# Patient Record
Sex: Male | Born: 1965 | Race: White | Hispanic: No | Smoking: Never smoker
Health system: Southern US, Community
[De-identification: ages and names within clinical notes are randomized; demographics above are authoritative.]

## PROBLEM LIST (undated history)

## (undated) DIAGNOSIS — F319 Bipolar disorder, unspecified: Secondary | ICD-10-CM

## (undated) DIAGNOSIS — K219 Gastro-esophageal reflux disease without esophagitis: Secondary | ICD-10-CM

## (undated) DIAGNOSIS — T7840XA Allergy, unspecified, initial encounter: Secondary | ICD-10-CM

## (undated) DIAGNOSIS — F4310 Post-traumatic stress disorder, unspecified: Secondary | ICD-10-CM

## (undated) DIAGNOSIS — R7303 Prediabetes: Secondary | ICD-10-CM

## (undated) DIAGNOSIS — M109 Gout, unspecified: Secondary | ICD-10-CM

## (undated) DIAGNOSIS — F329 Major depressive disorder, single episode, unspecified: Secondary | ICD-10-CM

## (undated) DIAGNOSIS — I10 Essential (primary) hypertension: Secondary | ICD-10-CM

## (undated) DIAGNOSIS — E538 Deficiency of other specified B group vitamins: Secondary | ICD-10-CM

## (undated) DIAGNOSIS — F419 Anxiety disorder, unspecified: Secondary | ICD-10-CM

## (undated) DIAGNOSIS — F32A Depression, unspecified: Secondary | ICD-10-CM

## (undated) HISTORY — PX: DENTAL SURGERY: SHX609

## (undated) HISTORY — PX: TYMPANOSTOMY TUBE PLACEMENT: SHX32

## (undated) HISTORY — PX: ADENOIDECTOMY: SUR15

## (undated) HISTORY — DX: Deficiency of other specified B group vitamins: E53.8

## (undated) HISTORY — DX: Gastro-esophageal reflux disease without esophagitis: K21.9

## (undated) HISTORY — PX: TONSILLECTOMY: SUR1361

## (undated) HISTORY — DX: Post-traumatic stress disorder, unspecified: F43.10

## (undated) HISTORY — DX: Prediabetes: R73.03

## (undated) HISTORY — DX: Bipolar disorder, unspecified: F31.9

## (undated) HISTORY — DX: Depression, unspecified: F32.A

## (undated) HISTORY — DX: Anxiety disorder, unspecified: F41.9

## (undated) HISTORY — DX: Allergy, unspecified, initial encounter: T78.40XA

---
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
## 2004-06-27 ENCOUNTER — Emergency Department: Payer: Self-pay | Admitting: Internal Medicine

## 2004-07-25 ENCOUNTER — Emergency Department: Payer: Self-pay | Admitting: Emergency Medicine

## 2006-02-18 ENCOUNTER — Emergency Department: Payer: Self-pay | Admitting: Emergency Medicine

## 2006-02-23 ENCOUNTER — Emergency Department: Payer: Self-pay | Admitting: Emergency Medicine

## 2006-10-04 ENCOUNTER — Emergency Department: Payer: Self-pay | Admitting: Emergency Medicine

## 2006-10-21 ENCOUNTER — Emergency Department: Payer: Self-pay | Admitting: Emergency Medicine

## 2006-12-08 ENCOUNTER — Emergency Department: Payer: Self-pay | Admitting: Emergency Medicine

## 2007-04-05 ENCOUNTER — Emergency Department: Payer: Self-pay | Admitting: Emergency Medicine

## 2007-08-16 ENCOUNTER — Emergency Department: Payer: Self-pay | Admitting: Emergency Medicine

## 2008-03-17 ENCOUNTER — Emergency Department: Payer: Self-pay | Admitting: Emergency Medicine

## 2008-10-20 ENCOUNTER — Emergency Department: Payer: Self-pay | Admitting: Emergency Medicine

## 2008-11-06 ENCOUNTER — Emergency Department: Payer: Self-pay | Admitting: Emergency Medicine

## 2009-12-28 ENCOUNTER — Emergency Department: Payer: Self-pay | Admitting: Emergency Medicine

## 2010-05-29 ENCOUNTER — Emergency Department: Payer: Self-pay | Admitting: Emergency Medicine

## 2010-06-28 ENCOUNTER — Emergency Department: Payer: Self-pay | Admitting: Emergency Medicine

## 2011-11-05 ENCOUNTER — Emergency Department: Payer: Self-pay | Admitting: *Deleted

## 2012-07-17 ENCOUNTER — Emergency Department: Payer: Self-pay | Admitting: Internal Medicine

## 2012-12-12 ENCOUNTER — Emergency Department: Payer: Self-pay | Admitting: Emergency Medicine

## 2013-04-25 ENCOUNTER — Emergency Department: Payer: Self-pay | Admitting: Emergency Medicine

## 2013-05-14 ENCOUNTER — Emergency Department: Payer: Self-pay | Admitting: Internal Medicine

## 2013-10-06 ENCOUNTER — Emergency Department: Payer: Self-pay | Admitting: Emergency Medicine

## 2013-10-06 LAB — CBC WITH DIFFERENTIAL/PLATELET
BASOS PCT: 1.1 %
Basophil #: 0.1 10*3/uL (ref 0.0–0.1)
Eosinophil #: 0.3 10*3/uL (ref 0.0–0.7)
Eosinophil %: 3.8 %
HCT: 42.4 % (ref 40.0–52.0)
HGB: 14.4 g/dL (ref 13.0–18.0)
Lymphocyte #: 2.5 10*3/uL (ref 1.0–3.6)
Lymphocyte %: 27.5 %
MCH: 30.6 pg (ref 26.0–34.0)
MCHC: 34 g/dL (ref 32.0–36.0)
MCV: 90 fL (ref 80–100)
MONOS PCT: 8.7 %
Monocyte #: 0.8 x10 3/mm (ref 0.2–1.0)
NEUTROS ABS: 5.4 10*3/uL (ref 1.4–6.5)
Neutrophil %: 58.9 %
PLATELETS: 212 10*3/uL (ref 150–440)
RBC: 4.72 10*6/uL (ref 4.40–5.90)
RDW: 13.8 % (ref 11.5–14.5)
WBC: 9.1 10*3/uL (ref 3.8–10.6)

## 2013-10-06 LAB — COMPREHENSIVE METABOLIC PANEL
ALBUMIN: 3.7 g/dL (ref 3.4–5.0)
ALT: 42 U/L (ref 12–78)
ANION GAP: 6 — AB (ref 7–16)
Alkaline Phosphatase: 116 U/L
BILIRUBIN TOTAL: 0.6 mg/dL (ref 0.2–1.0)
BUN: 10 mg/dL (ref 7–18)
CHLORIDE: 108 mmol/L — AB (ref 98–107)
CREATININE: 1.3 mg/dL (ref 0.60–1.30)
Calcium, Total: 8.9 mg/dL (ref 8.5–10.1)
Co2: 26 mmol/L (ref 21–32)
EGFR (Non-African Amer.): >60
GLUCOSE: 130 mg/dL — AB (ref 65–99)
Osmolality: 280 (ref 275–301)
Potassium: 3.5 mmol/L (ref 3.5–5.1)
SGOT(AST): 20 U/L (ref 15–37)
Sodium: 140 mmol/L (ref 136–145)
Total Protein: 7.5 g/dL (ref 6.4–8.2)

## 2014-01-07 ENCOUNTER — Emergency Department: Payer: Self-pay | Admitting: Emergency Medicine

## 2014-01-18 ENCOUNTER — Emergency Department: Payer: Self-pay | Admitting: Emergency Medicine

## 2014-03-20 ENCOUNTER — Emergency Department (HOSPITAL_COMMUNITY)
Admission: EM | Admit: 2014-03-20 | Discharge: 2014-03-21 | Disposition: A | Discharge status: Home / Self Care | Payer: Self-pay | Attending: Emergency Medicine | Admitting: Emergency Medicine

## 2014-03-20 ENCOUNTER — Encounter (HOSPITAL_COMMUNITY): Payer: Self-pay | Admitting: Emergency Medicine

## 2014-03-20 ENCOUNTER — Emergency Department (HOSPITAL_COMMUNITY): Payer: Self-pay

## 2014-03-20 DIAGNOSIS — Y929 Unspecified place or not applicable: Secondary | ICD-10-CM | POA: Insufficient documentation

## 2014-03-20 DIAGNOSIS — S6990XA Unspecified injury of unspecified wrist, hand and finger(s), initial encounter: Principal | ICD-10-CM

## 2014-03-20 DIAGNOSIS — S59919A Unspecified injury of unspecified forearm, initial encounter: Principal | ICD-10-CM

## 2014-03-20 DIAGNOSIS — Y9389 Activity, other specified: Secondary | ICD-10-CM | POA: Insufficient documentation

## 2014-03-20 DIAGNOSIS — S59909A Unspecified injury of unspecified elbow, initial encounter: Secondary | ICD-10-CM | POA: Insufficient documentation

## 2014-03-20 DIAGNOSIS — X58XXXA Exposure to other specified factors, initial encounter: Secondary | ICD-10-CM | POA: Insufficient documentation

## 2014-03-20 DIAGNOSIS — S6992XA Unspecified injury of left wrist, hand and finger(s), initial encounter: Secondary | ICD-10-CM

## 2014-03-20 DIAGNOSIS — Z880 Allergy status to penicillin: Secondary | ICD-10-CM | POA: Insufficient documentation

## 2014-03-20 MED ORDER — OXYCODONE-ACETAMINOPHEN 5-325 MG PO TABS
1.0000 | ORAL_TABLET | Freq: Once | ORAL | Status: AC
  Administered 2014-03-20: 1 via ORAL
  Filled 2014-03-20: qty 1

## 2014-03-20 NOTE — ED Provider Notes (Signed)
CSN: [id number]     Arrival date & time 03/20/14  2301 History  This chart was scribed for non-physician practitioner, Fayrene Helper, PA-C working with Olivia Mackie, MD by Luisa Dago, ED scribe. This patient was seen in room TR11C/TR11C and the patient's care was started at 11:30 PM.    Chief Complaint  Patient presents with  . Wrist Pain     The history is provided by the patient. No language interpreter was used.   HPI Comments: Wayne Brown is a 48 y.o. male who presents to the Emergency Department complaining of acute onset of left wrist pain that occurred today. Pt states that he was fixing a car when he started experiencing the pain in his affected wrist. He states that in the past he was diagnosed with carpel tunnel at a past ED visit, but never followed up with diagnosis at the PCP. Pt reports a tingling sensation to the affected hand and mild swelling. Denies any fever or chills.   History reviewed. No pertinent past medical history. History reviewed. No pertinent past surgical history. No family history on file. History  Substance Use Topics  . Smoking status: Never Smoker   . Smokeless tobacco: Not on file  . Alcohol Use: No    Review of Systems  Constitutional: Negative for fever and chills.  HENT: Negative for congestion.   Respiratory: Negative for cough and shortness of breath.   Musculoskeletal: Positive for arthralgias and joint swelling. Negative for myalgias.  Neurological: Negative for dizziness, weakness, light-headedness and headaches.   Allergies  Erythromycin; Penicillins; Toradol; and Tramadol  Home Medications   Prior to Admission medications   Not on File   BP 152/78  Pulse 87  Temp(Src) 97.8 F (36.6 C) (Oral)  Resp 22  Ht  (1.956 m)  Wt 245 lb (111.131 kg)  BMI 29.05 kg/m2  SpO2 97%  Physical Exam  Nursing note and vitals reviewed. Constitutional: He is oriented to person, place, and time. He appears well-developed and  well-nourished. No distress.  HENT:  Head: Normocephalic and atraumatic.  Eyes: Conjunctivae and EOM are normal.  Neck: Neck supple.  Cardiovascular: Normal rate.   Pulmonary/Chest: Effort normal. No respiratory distress.  Musculoskeletal: Normal range of motion.  Left hand:FROM throughout all finger decreased grip strength secondary to pain. Brisk capillary refill.  Left wrist: mild edema noted to the volar aspect of wrist with tenderness along the volar aspect of forearm. Decreased flexion, extension, and supination, and pronation secondary to pain. Radial pulse is 2 plus. No abscess. No erythema. No warmth. No evidence of joint infection. Compartment is soft.  Elbows are normal with FROM.  Neurological: He is alert and oriented to person, place, and time.  Skin: Skin is warm and dry.  Psychiatric: He has a normal mood and affect. His behavior is normal.    ED Course  Procedures (including critical care time)  DIAGNOSTIC STUDIES: Oxygen Saturation is 97% on RA, normal by my interpretation.    COORDINATION OF CARE: 11:37 PM-suspect muscle/tendon injury.  Doubt septic joint, or infection.  Will order X-ray of left wrist.  Pt advised of plan for treatment and pt agrees.  12:25 AM Xray neg for acute fx.  Pt is NVI.  Doubt DVT.  Wrist brace provide for comfort, RICE therapy discussed.  Pain medication given.  Hand specialist referral as needed, return precaution discussed.    Labs Review Labs Reviewed - No data to display  Imaging Review Dg Wrist Complete  Left  03/21/2014   CLINICAL DATA:  WRIST PAIN  EXAM: LEFT WRIST - COMPLETE 3+ VIEW  COMPARISON:  None.  FINDINGS: There is no evidence of fracture or dislocation. There is no evidence of arthropathy or other focal bone abnormality. Soft tissues are unremarkable. Carpal rows intact.  IMPRESSION: Negative.   Electronically Signed   By: Oley Balm M.D.   On: 03/21/2014 00:06     EKG Interpretation None      MDM   Final  diagnoses:  Left wrist injury, initial encounter    BP 152/78  Pulse 87  Temp(Src) 97.8 F (36.6 C) (Oral)  Resp 22  Ht  (1.956 m)  Wt 245 lb (111.131 kg)  BMI 29.05 kg/m2  SpO2 97%  I have reviewed nursing notes and vital signs. I personally reviewed the imaging tests through PACS system  I reviewed available ER/hospitalization records thought the EMR   I personally performed the services described in this documentation, which was scribed in my presence. The recorded information has been reviewed and is accurate.    Fayrene Helper, PA-C 03/21/14 [id number]

## 2014-03-20 NOTE — ED Notes (Signed)
Pt presents with left wrist pain and swelling after working on a car this afternoon- pt reports hx of carpal tunnel.  Denies injuring wrist while working on car, radial pulse present.  Pt able to move fingers without difficulty.

## 2014-03-21 MED ORDER — OXYCODONE-ACETAMINOPHEN 5-325 MG PO TABS: 1.0000 | ORAL_TABLET | Freq: Three times a day (TID) | ORAL | Status: DC | PRN

## 2014-03-21 NOTE — Discharge Instructions (Signed)
NO evidence of any broken bone.  You may have tendon or muscle injury that would best be evaluated by a hand specialist.  Follow up closely.  Wear splint for support.  Take pain medication as needed.  Return if your condition worsen, you develop rash, chest pain, trouble breathing, numbness or if you have other concerns.     Wrist Pain Wrist injuries are frequent in adults and children. A sprain is an injury to the ligaments that hold your bones together. A strain is an injury to muscle or muscle cord-like structures (tendons) from stretching or pulling. Generally, when wrists are moderately tender to touch following a fall or injury, a break in the bone (fracture) may be present. Most wrist sprains or strains are better in 3 to 5 days, but complete healing may take several weeks. HOME CARE INSTRUCTIONS   Put ice on the injured area.  Put ice in a plastic bag.  Place a towel between your skin and the bag.  Leave the ice on for 15-20 minutes, 3-4 times a day, for the first 2 days, or as directed by your health care provider.  Keep your arm raised above the level of your heart whenever possible to reduce swelling and pain.  Rest the injured area for at least 48 hours or as directed by your health care provider.  If a splint or elastic bandage has been applied, use it for as long as directed by your health care provider or until seen by a health care provider for a follow-up exam.  Only take over-the-counter or prescription medicines for pain, discomfort, or fever as directed by your health care provider.  Keep all follow-up appointments. You may need to follow up with a specialist or have follow-up X-rays. Improvement in pain level is not a guarantee that you did not fracture a bone in your wrist. The only way to determine whether or not you have a broken bone is by X-ray. SEEK IMMEDIATE MEDICAL CARE IF:   Your fingers are swollen, very red, white, or cold and blue.  Your fingers are numb  or tingling.  You have increasing pain.  You have difficulty moving your fingers. MAKE SURE YOU:   Understand these instructions.  Will watch your condition.  Will get help right away if you are not doing well or get worse. Document Released: 04/15/2005 Document Revised: 07/11/2013 Document Reviewed: 08/27/2010 [facility] Patient Information 2015 [place], [place]. This information is not intended to replace advice given to you by your health care provider. Make sure you discuss any questions you have with your health care provider.

## 2014-03-21 NOTE — ED Provider Notes (Signed)
Medical screening examination/treatment/procedure(s) were performed by non-physician practitioner and as supervising physician I was immediately available for consultation/collaboration.   EKG Interpretation None       Rolonda Pontarelli M Raiza Kiesel, MD 03/21/14 0429 

## 2014-09-17 ENCOUNTER — Emergency Department: Payer: Self-pay | Admitting: Emergency Medicine

## 2014-10-04 ENCOUNTER — Emergency Department: Payer: Self-pay | Admitting: Emergency Medicine

## 2014-11-07 ENCOUNTER — Emergency Department
Admit: 2014-11-07 | Disposition: A | Discharge status: Home / Self Care | Payer: Self-pay | Admitting: Emergency Medicine

## 2014-12-13 ENCOUNTER — Ambulatory Visit: Payer: Self-pay

## 2014-12-13 ENCOUNTER — Ambulatory Visit: Payer: Self-pay | Admitting: Ophthalmology

## 2014-12-20 ENCOUNTER — Ambulatory Visit: Payer: Self-pay | Admitting: Ophthalmology

## 2014-12-26 ENCOUNTER — Ambulatory Visit: Payer: Self-pay | Admitting: Internal Medicine

## 2014-12-26 ENCOUNTER — Ambulatory Visit: Payer: Self-pay | Admitting: Ophthalmology

## 2015-03-14 ENCOUNTER — Ambulatory Visit: Payer: Self-pay | Admitting: Ophthalmology

## 2015-03-14 ENCOUNTER — Ambulatory Visit: Payer: Self-pay

## 2015-04-08 ENCOUNTER — Encounter (INDEPENDENT_AMBULATORY_CARE_PROVIDER_SITE_OTHER): Payer: Self-pay

## 2015-04-08 ENCOUNTER — Ambulatory Visit: Payer: Self-pay

## 2015-04-18 ENCOUNTER — Other Ambulatory Visit: Payer: Self-pay

## 2015-04-25 ENCOUNTER — Ambulatory Visit: Payer: Self-pay

## 2015-08-20 ENCOUNTER — Encounter: Payer: Self-pay | Admitting: Urgent Care

## 2015-08-20 DIAGNOSIS — R2231 Localized swelling, mass and lump, right upper limb: Secondary | ICD-10-CM | POA: Insufficient documentation

## 2015-08-20 DIAGNOSIS — M25511 Pain in right shoulder: Secondary | ICD-10-CM | POA: Insufficient documentation

## 2015-08-20 NOTE — ED Notes (Signed)
Patient presents with c/o RIGHT shoulder pain with (+) radiation into arm and hand. (+) RIGHT hand swelling. Patient denies injury. No numbness or tingling reported.

## 2015-08-21 ENCOUNTER — Emergency Department
Admission: EM | Admit: 2015-08-21 | Discharge: 2015-08-21 | Disposition: A | Discharge status: Home / Self Care | Payer: Self-pay | Attending: Emergency Medicine | Admitting: Emergency Medicine

## 2015-08-29 ENCOUNTER — Other Ambulatory Visit: Payer: Self-pay

## 2015-08-29 LAB — CBC AND DIFFERENTIAL
HCT: 42 % (ref 41–53)
Hemoglobin: 14.9 g/dL (ref 13.5–17.5)
Neutrophils Absolute: 5 /uL
Platelets: 246 10*3/uL (ref 150–399)
WBC: 8.2 10^3/mL

## 2015-08-29 LAB — BASIC METABOLIC PANEL
BUN: 8 mg/dL (ref 4–21)
CREATININE: 1.1 mg/dL (ref 0.6–1.3)
Glucose: 86 mg/dL
POTASSIUM: 4 mmol/L (ref 3.4–5.3)
Sodium: 139 mmol/L (ref 137–147)

## 2015-08-29 LAB — LIPID PANEL
Cholesterol: 158 mg/dL (ref 0–200)
HDL: 32 mg/dL — AB (ref 35–70)
LDL Cholesterol: 98 mg/dL
TRIGLYCERIDES: 141 mg/dL (ref 40–160)

## 2015-08-29 LAB — HEPATIC FUNCTION PANEL
ALT: 25 U/L (ref 10–40)
AST: 17 U/L (ref 14–40)
Alkaline Phosphatase: 144 U/L — AB (ref 25–125)
BILIRUBIN, TOTAL: 0.9 mg/dL

## 2015-09-05 DIAGNOSIS — K219 Gastro-esophageal reflux disease without esophagitis: Secondary | ICD-10-CM

## 2015-09-05 DIAGNOSIS — M25539 Pain in unspecified wrist: Secondary | ICD-10-CM

## 2015-09-05 DIAGNOSIS — M25439 Effusion, unspecified wrist: Secondary | ICD-10-CM | POA: Insufficient documentation

## 2015-09-12 ENCOUNTER — Ambulatory Visit: Payer: Self-pay | Admitting: Ophthalmology

## 2015-09-26 ENCOUNTER — Ambulatory Visit: Payer: Self-pay | Admitting: Ophthalmology

## 2015-09-26 ENCOUNTER — Ambulatory Visit: Payer: Self-pay

## 2015-10-17 ENCOUNTER — Ambulatory Visit: Payer: Self-pay

## 2015-10-17 VITALS — BP 126/84 | HR 67 | Resp 14 | Wt 244.0 lb

## 2015-10-17 DIAGNOSIS — Z91199 Patient's noncompliance with other medical treatment and regimen due to unspecified reason: Secondary | ICD-10-CM

## 2015-10-17 DIAGNOSIS — Z5329 Procedure and treatment not carried out because of patient's decision for other reasons: Secondary | ICD-10-CM

## 2015-11-14 ENCOUNTER — Ambulatory Visit: Payer: Self-pay | Admitting: Family Medicine

## 2015-11-14 VITALS — BP 125/84 | HR 73 | Ht 77.0 in | Wt 259.0 lb

## 2015-11-14 DIAGNOSIS — F4310 Post-traumatic stress disorder, unspecified: Secondary | ICD-10-CM | POA: Insufficient documentation

## 2015-11-14 DIAGNOSIS — R748 Abnormal levels of other serum enzymes: Secondary | ICD-10-CM | POA: Insufficient documentation

## 2015-11-14 DIAGNOSIS — K219 Gastro-esophageal reflux disease without esophagitis: Secondary | ICD-10-CM

## 2015-11-14 DIAGNOSIS — M109 Gout, unspecified: Secondary | ICD-10-CM | POA: Insufficient documentation

## 2015-11-14 DIAGNOSIS — R635 Abnormal weight gain: Secondary | ICD-10-CM

## 2015-11-14 DIAGNOSIS — R0789 Other chest pain: Secondary | ICD-10-CM | POA: Insufficient documentation

## 2015-11-14 DIAGNOSIS — R1310 Dysphagia, unspecified: Secondary | ICD-10-CM | POA: Insufficient documentation

## 2015-11-14 MED ORDER — FEXOFENADINE HCL 180 MG PO TABS: 180.0000 mg | ORAL_TABLET | Freq: Every day | ORAL | Status: DC

## 2015-11-14 MED ORDER — RANITIDINE HCL 150 MG PO TABS: 150.0000 mg | ORAL_TABLET | Freq: Two times a day (BID) | ORAL | Status: DC

## 2015-11-14 NOTE — Patient Instructions (Addendum)
Start back on the ranitidine twice a day Limit triggers foods and drinks If your symptoms don't improve over the next week or two, let us know and we'll refer you to a gastroenterologist If you don't hear about your labs in one week, please give us a call Try to work on modest weight loss, and aim to lose 20 pounds over the next 3 months  Heartburn Heartburn is a type of pain or discomfort that can happen in the throat or chest. It is often described as a burning pain. It may also cause a bad taste in the mouth. Heartburn may feel worse when you lie down or bend over, and it is often worse at night. Heartburn may be caused by stomach contents that move back up into the esophagus (reflux). HOME CARE INSTRUCTIONS Take these actions to decrease your discomfort and to help avoid complications. Diet  Follow a diet as recommended by your health care provider. This may involve avoiding foods and drinks such as:  Coffee and tea (with or without caffeine).  Drinks that contain alcohol.  Energy drinks and sports drinks.  Carbonated drinks or sodas.  Chocolate and cocoa.  Peppermint and mint flavorings.  Garlic and onions.  Horseradish.  Spicy and acidic foods, including peppers, chili powder, curry powder, vinegar, hot sauces, and barbecue sauce.  Citrus fruit juices and citrus fruits, such as oranges, lemons, and limes.  Tomato-based foods, such as red sauce, chili, salsa, and pizza with red sauce.  Fried and fatty foods, such as donuts, french fries, potato chips, and high-fat dressings.  High-fat meats, such as hot dogs and fatty cuts of red and white meats, such as rib eye steak, sausage, ham, and bacon.  High-fat dairy items, such as whole milk, butter, and cream cheese.  Eat small, frequent meals instead of large meals.  Avoid drinking large amounts of liquid with your meals.  Avoid eating meals during the 2-3 hours before bedtime.  Avoid lying down right after you  eat.  Do not exercise right after you eat. General Instructions  Pay attention to any changes in your symptoms.  Take over-the-counter and prescription medicines only as told by your health care provider. Do not take aspirin, ibuprofen, or other NSAIDs unless your health care provider told you to do so.  Do not use any tobacco products, including cigarettes, chewing tobacco, and e-cigarettes. If you need help quitting, ask your health care provider.  Wear loose-fitting clothing. Do not wear anything tight around your waist that causes pressure on your abdomen.  Raise (elevate) the head of your bed about 6 inches (15 cm).  Try to reduce your stress, such as with yoga or meditation. If you need help reducing stress, ask your health care provider.  If you are overweight, reduce your weight to an amount that is healthy for you. Ask your health care provider for guidance about a safe weight loss goal.  Keep all follow-up visits as told by your health care provider. This is important. SEEK MEDICAL CARE IF:  You have new symptoms.  You have unexplained weight loss.  You have difficulty swallowing, or it hurts to swallow.  You have wheezing or a persistent cough.  Your symptoms do not improve with treatment.  You have frequent heartburn for more than two weeks. SEEK IMMEDIATE MEDICAL CARE IF:  You have pain in your arms, neck, jaw, teeth, or back.  You feel sweaty, dizzy, or light-headed.  You have chest pain or shortness of breath.  You vomit and your vomit looks like blood or coffee grounds.  Your stool is bloody or black.   This information is not intended to replace advice given to you by your health care provider. Make sure you discuss any questions you have with your health care provider.   Document Released: 11/22/2008 Document Revised: 03/27/2015 Document Reviewed: 10/31/2014 Elsevier Interactive Patient Education [employer]

## 2015-11-14 NOTE — Assessment & Plan Note (Signed)
Limit triggers; get back on BID H2 blocker; check H pylori; if not improving, then refer to GI

## 2015-11-14 NOTE — Assessment & Plan Note (Signed)
Limit triggers; start on BID H2 blockers; offered referral to GI; patient declined at this time, but I encouraged him to call if symptoms not resolving in 1-2 weeks; check H pylori

## 2015-11-14 NOTE — Progress Notes (Signed)
BP 125/84 mmHg  Pulse 73  Ht  (1.956 m)  Wt 259 lb (117.482 kg)  BMI 30.71 kg/m2   Subjective:    Patient ID: Wayne Brown, male    DOB: 05-19-1966, 50 y.o.   MRN: [id number]  HPI: Wayne Brown is a 50 y.o. male  Chief Complaint  Patient presents with  . Gastroesophageal Reflux    pt rpts feeling like oral tablets stay "stuck in throat"  . Medication Refill   He is taking ranitidine 150 mg BID for heartburn; he actually tried decreasing the pills to just once a day; could feel it start building back up; he gets clear bitter acid coming up in the chest; not heart-like; can relieve it with belching and once the liquid comes up, the sensation goes away; likes spicy foods; tomato-based foods and spicy foods are triggers; no blood in the stool; they just ran an EKG on him  Low HDL; doesn't smoke; a little obese, weight fluctuates, just ate recently but weight is really up from last time  We reviewed his previous labs; they checked his liver enzymes years ago and told hiim something was elevated; believes he was tested for hepatitis in the past; he is germophobic  Eyes have been burning, itching in the medial canthus; coming from the pollen; benadryl makes him sleepy  Takes hydroxyzine and clonazepam for PTSD  Relevant past medical, surgical, family and social history reviewed and updated as indicated. Past Medical History  Diagnosis Date  . Bipolar disorder (HCC)   . Post traumatic stress disorder   . GERD (gastroesophageal reflux disease)    No past surgical history on file.  Family History  Problem Relation Age of Onset  . Cancer Father   Mother colon cancer Father had triple bypass, died at age 43 from 2-3 kinds of cancer; father smoked  Social History  Substance Use Topics  . Smoking status: Never Smoker   . Smokeless tobacco: Never Used  . Alcohol Use: No   Interim medical history since last visit reviewed. Allergies and medications reviewed and  updated.  Review of Systems Per HPI unless specifically indicated above     Objective:    BP 125/84 mmHg  Pulse 73  Ht  (1.956 m)  Wt 259 lb (117.482 kg)  BMI 30.71 kg/m2  Wt Readings from Last 3 Encounters:  12/12/15 244 lb (110.678 kg)  11/14/15 259 lb (117.482 kg)  10/17/15 244 lb (110.678 kg)    Physical Exam  Constitutional: He appears well-developed and well-nourished. No distress.  Cardiovascular: Normal rate and regular rhythm.   Pulmonary/Chest: Effort normal and breath sounds normal.  Abdominal: He exhibits no distension.  Neurological: He is alert.  Skin: Skin is warm.  Psychiatric: He has a normal mood and affect.      Assessment & Plan:   Problem List Items Addressed This Visit      Digestive   Dysphagia    Limit triggers; start on BID H2 blockers; offered referral to GI; patient declined at this time, but I encouraged him to call if symptoms not resolving in 1-2 weeks; check H pylori      Relevant Orders   H Pylori, IGM, IGG, IGA AB (Completed)   GERD (gastroesophageal reflux disease)    Limit triggers; get back on BID H2 blocker; check H pylori; if not improving, then refer to GI        Other   Abnormal weight gain  Relevant Orders   TSH (Completed)   Acute gout   Relevant Orders   Uric acid (Completed)   Alkaline phosphatase elevation - Primary   Relevant Orders   Hepatic Function Panel (6) (Completed)   Alkaline Phosphatase, Isoenzymes (Completed)   Atypical chest pain   PTSD (post-traumatic stress disorder)      Follow up plan: Return in about 4 weeks (around 12/12/2015) for follow-up on GERD symptoms, weight, swallowing.  An after-visit summary was printed and given to the patient at check-out.  Please see the patient instructions which may contain other information and recommendations beyond what is mentioned above in the assessment and plan.  Meds ordered this encounter  Medications  . DISCONTD: hydrOXYzine (ATARAX/VISTARIL)  10 MG tablet    Sig: Take by mouth 3 (three) times daily as needed (unknown dose per pt).  . clonazePAM (KLONOPIN) 0.5 MG tablet    Sig: Take 0.5 mg by mouth 2 (two) times daily.  Marland Kitchen. DISCONTD: ranitidine (ZANTAC) 150 MG tablet    Sig: Take 1 tablet (150 mg total) by mouth 2 (two) times daily.    Dispense:  180 tablet    Refill:  1  . DISCONTD: fexofenadine (ALLEGRA ALLERGY) 180 MG tablet    Sig: Take 1 tablet (180 mg total) by mouth daily. PRN allergies    Dispense:  90 tablet    Refill:  1    Orders Placed This Encounter  Procedures  . Uric acid  . TSH  . H Pylori, IGM, IGG, IGA AB  . Hepatic Function Panel (6)  . Alkaline Phosphatase, Isoenzymes

## 2015-11-16 LAB — URIC ACID: Uric Acid: 5.2 mg/dL (ref 3.7–8.6)

## 2015-11-16 LAB — HEPATIC FUNCTION PANEL (6)
ALT: 29 IU/L (ref 0–44)
AST: 19 IU/L (ref 0–40)
Albumin: 4.3 g/dL (ref 3.5–5.5)
Alkaline Phosphatase: 141 IU/L — ABNORMAL HIGH (ref 39–117)
BILIRUBIN, DIRECT: 0.19 mg/dL (ref 0.00–0.40)
Bilirubin Total: 0.7 mg/dL (ref 0.0–1.2)

## 2015-11-16 LAB — ALKALINE PHOSPHATASE, ISOENZYMES
BONE FRACTION: 39 % (ref 12–68)
INTESTINAL FRAC.: 0 % (ref 0–18)
LIVER FRACTION: 61 % (ref 13–88)

## 2015-11-16 LAB — H PYLORI, IGM, IGG, IGA AB
H Pylori IgG: 5.5 U/mL — ABNORMAL HIGH (ref 0.0–0.8)
H pylori, IgM Abs: <9 units (ref 0.0–8.9)
H. pylori, IgA Abs: <9 units (ref 0.0–8.9)

## 2015-11-16 LAB — TSH: TSH: 2.48 u[IU]/mL (ref 0.450–4.500)

## 2015-11-19 ENCOUNTER — Other Ambulatory Visit: Payer: Self-pay | Admitting: Urology

## 2015-11-19 ENCOUNTER — Telehealth: Payer: Self-pay

## 2015-11-19 MED ORDER — FEXOFENADINE HCL 180 MG PO TABS: 180.0000 mg | ORAL_TABLET | Freq: Every day | ORAL | Status: DC

## 2015-11-19 MED ORDER — RANITIDINE HCL 150 MG PO TABS: 150.0000 mg | ORAL_TABLET | Freq: Two times a day (BID) | ORAL | Status: DC

## 2015-11-19 MED ORDER — INDOMETHACIN 50 MG PO CAPS: 50.0000 mg | ORAL_CAPSULE | Freq: Three times a day (TID) | ORAL | Status: DC

## 2015-11-19 MED ORDER — FUROSEMIDE 20 MG PO TABS: 20.0000 mg | ORAL_TABLET | ORAL | Status: DC | PRN

## 2015-11-19 MED ORDER — HYDROXYZINE HCL 10 MG PO TABS: 10.0000 mg | ORAL_TABLET | Freq: Three times a day (TID) | ORAL | Status: DC | PRN

## 2015-11-19 NOTE — Telephone Encounter (Signed)
Patient called and stated that [facility]. I noticed that the wrong pharmacy was in the patients record. Can we resend the RX from his 4/27 visit.

## 2015-11-22 IMAGING — CR DG KNEE COMPLETE 4+V*L*
1 series · 5 of 5 positions shown · non-contrast
Comparison: None.

CLINICAL DATA: Semi pro wrestler, injured knee at a match a few
days ago, worsening pain today.

EXAM:
LEFT KNEE - COMPLETE 4+ VIEW

[Series 1: t knee ap left · 0.14mm/px · 5 of 5 slices shown]
[im 1/5]
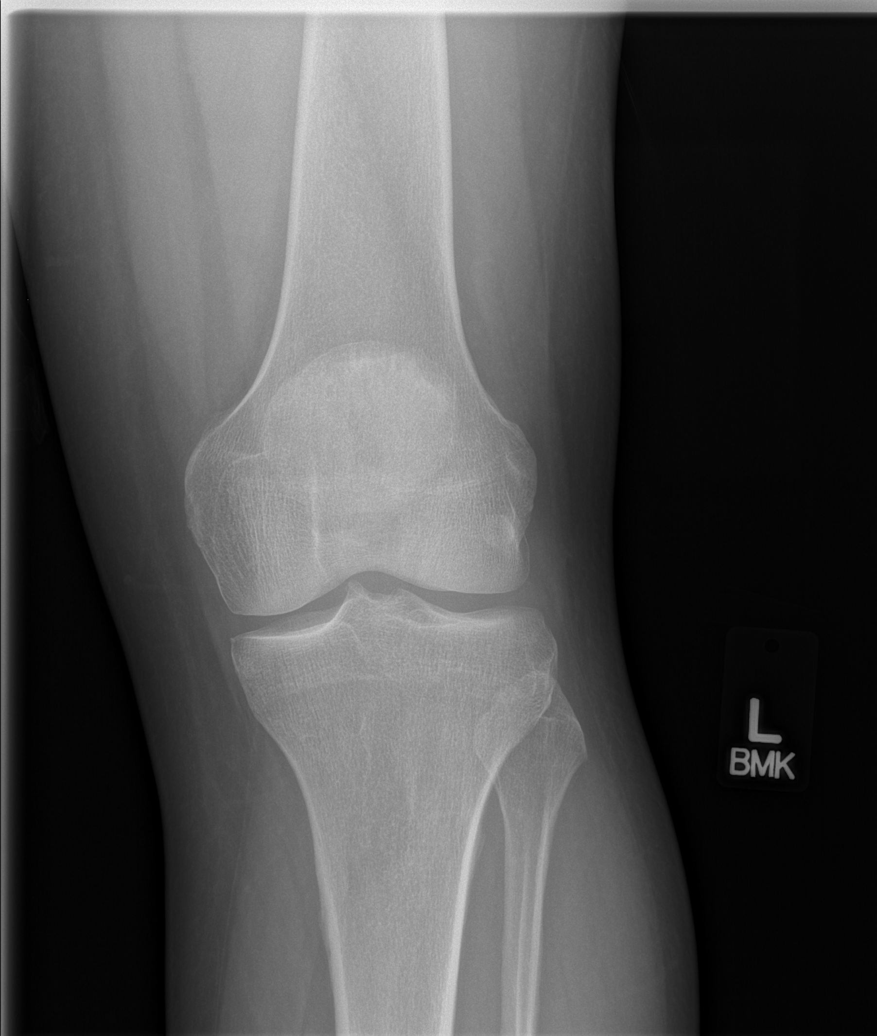
[im 2/5]
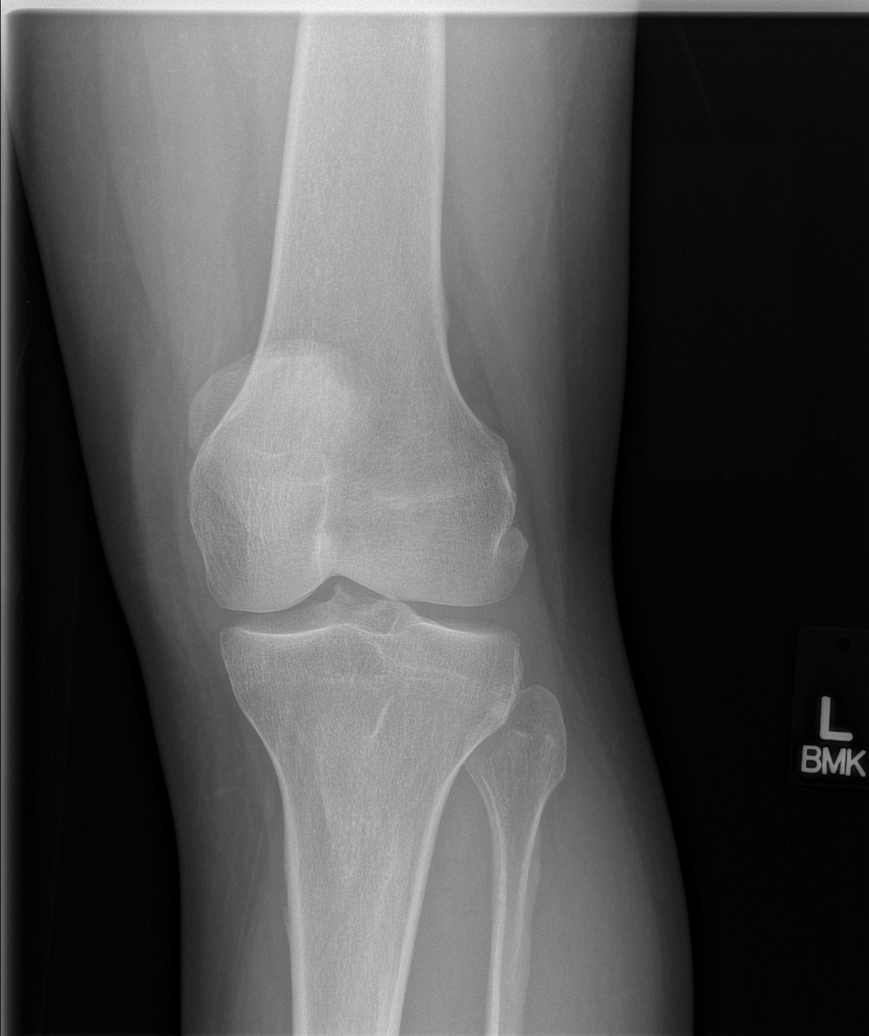
[im 3/5]
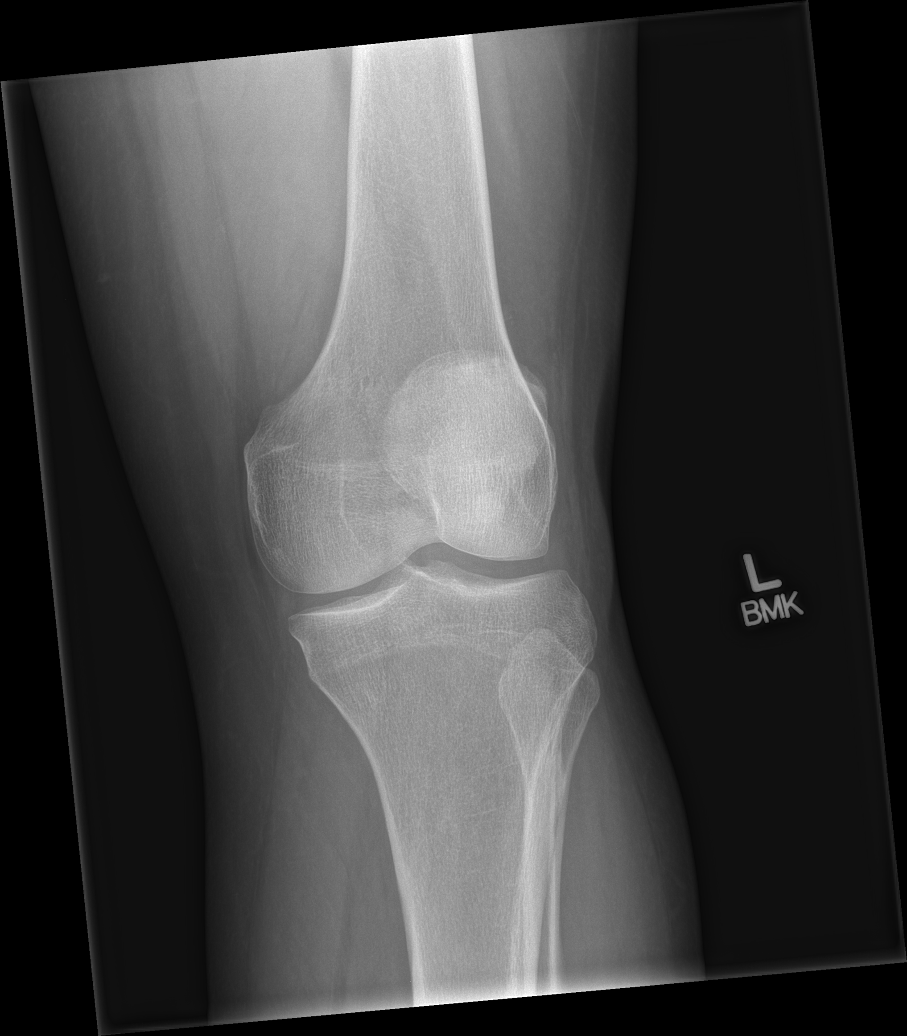
[im 4/5]
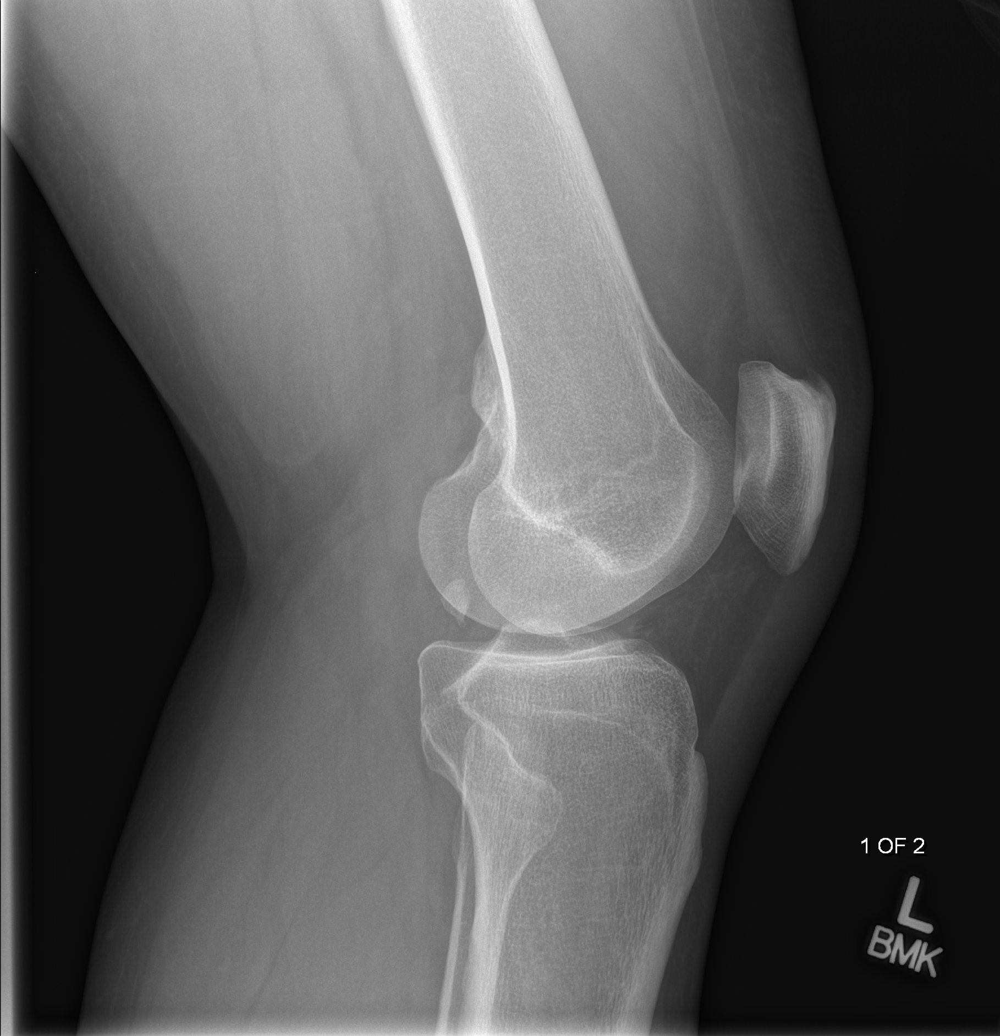
[im 5/5]
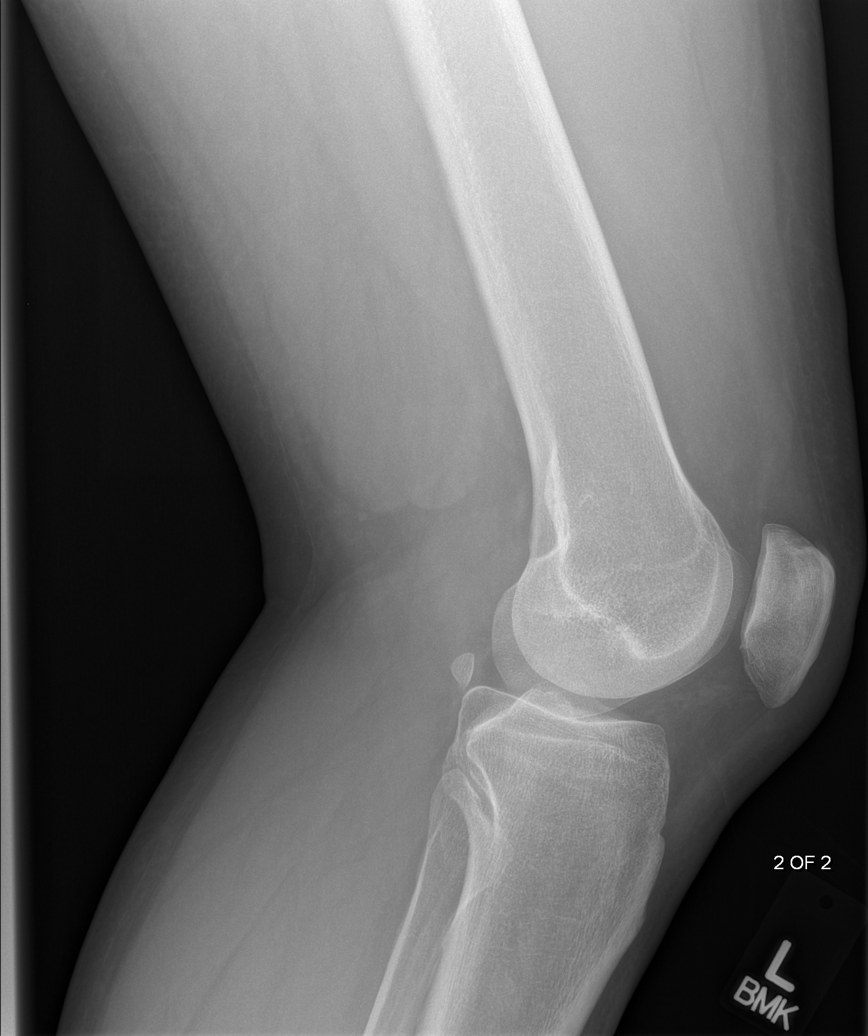

[5 of 5 positions shown; findings below may reference images not displayed]

FINDINGS: There is no evidence of fracture, dislocation, or joint effusion.
There is no evidence of arthropathy or other focal bone abnormality.
Soft tissues are unremarkable.
IMPRESSION: Negative.

  By: Slay Baires

## 2015-12-12 ENCOUNTER — Ambulatory Visit: Payer: Self-pay | Admitting: Family Medicine

## 2015-12-12 VITALS — BP 157/98 | HR 92 | Resp 16 | Ht 76.0 in | Wt 244.0 lb

## 2015-12-12 DIAGNOSIS — M25511 Pain in right shoulder: Secondary | ICD-10-CM

## 2015-12-12 DIAGNOSIS — K219 Gastro-esophageal reflux disease without esophagitis: Secondary | ICD-10-CM

## 2015-12-12 DIAGNOSIS — R748 Abnormal levels of other serum enzymes: Secondary | ICD-10-CM

## 2015-12-12 DIAGNOSIS — N644 Mastodynia: Secondary | ICD-10-CM | POA: Insufficient documentation

## 2015-12-12 MED ORDER — MELOXICAM 15 MG PO TABS: 15.0000 mg | ORAL_TABLET | Freq: Every day | ORAL | Status: DC

## 2015-12-12 NOTE — Progress Notes (Signed)
Patient: Wayne Brown Male    DOB: 1965/08/17   50 y.o.   MRN: [id number] Visit Date: 12/12/2015  Today's Provider: [facility]   Chief Complaint  Patient presents with  . Results  . Shoulder Pain  . Arm Swelling  . Breast Pain    left nipple tenderness   Subjective:    HPI 50 yo male here for recheck on labs. Here to recheck his labs.  Has chronically elevated liver enzyme. No h pylori. Better on medication. Allergies are better.  Also, with shoulder and arm swelling.  Treated for gout.  Told has a pinched nerve. Neck and arm pain on his right arm. Does see psychiatry.  Patient can not sleep from pain. Feels like it was a pinched nerve. Was treated for gout. Did tolerate the gout medication. Does also have chest pain, at his nipple. Sore to touch.   Feels like a stabbing.  Did have a piercing years ago..     Allergies  Allergen Reactions  . Erythromycin Other (See Comments)    Depression per pt  . Toradol [Ketorolac Tromethamine] Nausea And Vomiting  . Tramadol Nausea And Vomiting  . Vicodin [Hydrocodone-Acetaminophen] Nausea And Vomiting  . Lithium Rash    "lithium rash" per pt  . Penicillins Rash    Per pt rxn was an infant, familial   Previous Medications   CLONAZEPAM (KLONOPIN) 0.5 MG TABLET    Take 0.5 mg by mouth 2 (two) times daily.   FEXOFENADINE (ALLEGRA ALLERGY) 180 MG TABLET    Take 1 tablet (180 mg total) by mouth daily. PRN allergies   FUROSEMIDE (LASIX) 20 MG TABLET    Take 1 tablet (20 mg total) by mouth as needed (per pt).   HYDROXYZINE (ATARAX/VISTARIL) 10 MG TABLET    Take 1 tablet (10 mg total) by mouth 3 (three) times daily as needed (unknown dose per pt).   RANITIDINE (ZANTAC) 150 MG TABLET    Take 1 tablet (150 mg total) by mouth 2 (two) times daily.    Review of Systems  Constitutional: Negative.   Musculoskeletal: Positive for joint swelling and arthralgias.  Skin:       Left nipple tender to palpation.     Social  History  Substance Use Topics  . Smoking status: Never Smoker   . Smokeless tobacco: Never Used  . Alcohol Use: No   Objective:   BP 157/98 mmHg  Pulse 92  Resp 16  Ht 6\' 4"  (1.93 m)  Wt 244 lb (110.678 kg)  BMI 29.71 kg/m2  Physical Exam  Constitutional: He appears well-developed and well-nourished.  Cardiovascular: Normal rate and regular rhythm.   Pulmonary/Chest: Effort normal and breath sounds normal.  Musculoskeletal: He exhibits tenderness (right shoulder and left nipple.  ).  Skin:  Tender left nipple with no erythema Scar tissue from previous ring.        Assessment & Plan:     1. Breast pain, left Will check ultrasound.  - US Unlisted Procedure Breast; Future  2. Shoulder pain, right New problem. Treat with Mobic.    - meloxicam (MOBIC) 15 MG tablet; Take 1 tablet (15 mg total) by mouth daily.  Dispense: 90 tablet; Refill: 3  3. Alkaline phosphatase elevation Stable.    4. Gastroesophageal reflux disease, esophagitis presence not specified Stable. Continue current medication.    BP elevated today.  Recheck at folllow up.      Lorie PhenixNancy Tad Fancher, MD  [facility]

## 2016-01-01 ENCOUNTER — Other Ambulatory Visit: Payer: Self-pay | Admitting: Urology

## 2016-01-01 DIAGNOSIS — N644 Mastodynia: Secondary | ICD-10-CM

## 2016-01-09 ENCOUNTER — Ambulatory Visit
Admission: RE | Admit: 2016-01-09 | Discharge: 2016-01-09 | Disposition: A | Discharge status: Home / Self Care | Payer: Self-pay | Source: Ambulatory Visit | Attending: Urology | Admitting: Urology

## 2016-01-09 DIAGNOSIS — N644 Mastodynia: Secondary | ICD-10-CM

## 2016-02-13 ENCOUNTER — Ambulatory Visit: Payer: Self-pay | Admitting: Family Medicine

## 2016-02-13 VITALS — BP 153/91 | HR 111 | Wt 251.0 lb

## 2016-02-13 DIAGNOSIS — L259 Unspecified contact dermatitis, unspecified cause: Secondary | ICD-10-CM

## 2016-02-13 DIAGNOSIS — N62 Hypertrophy of breast: Secondary | ICD-10-CM

## 2016-02-13 MED ORDER — RANITIDINE HCL 150 MG PO TABS: 150.0000 mg | ORAL_TABLET | Freq: Two times a day (BID) | ORAL | 1 refills | Status: DC

## 2016-02-13 NOTE — Progress Notes (Signed)
   Subjective:    Patient ID: Wayne Brown, male    DOB: 1966/05/04, 50 y.o.   MRN: [id number]  HPI Patient presents today with soreness around left nipple Rash on left foot  Patient Active Problem List   Diagnosis Date Noted  . Breast pain, left 12/12/2015  . Shoulder pain, right 12/12/2015  . Alkaline phosphatase elevation 11/14/2015  . Atypical chest pain 11/14/2015  . PTSD (post-traumatic stress disorder) 11/14/2015  . Dysphagia 11/14/2015  . Abnormal weight gain 11/14/2015  . Acute gout 11/14/2015  . GERD (gastroesophageal reflux disease) 09/05/2015  . Pain and swelling of wrist 09/05/2015      Medication List       Accurate as of 02/13/16  8:07 PM. Always use your most recent med list.          clonazePAM 0.5 MG tablet Commonly known as:  KLONOPIN Take 0.5 mg by mouth 2 (two) times daily.   fexofenadine 180 MG tablet Commonly known as:  ALLEGRA ALLERGY Take 1 tablet (180 mg total) by mouth daily. PRN allergies   furosemide 20 MG tablet Commonly known as:  LASIX Take 1 tablet (20 mg total) by mouth as needed (per pt).   hydrOXYzine 10 MG tablet Commonly known as:  ATARAX/VISTARIL Take 1 tablet (10 mg total) by mouth 3 (three) times daily as needed (unknown dose per pt).   meloxicam 15 MG tablet Commonly known as:  MOBIC Take 1 tablet (15 mg total) by mouth daily.   ranitidine 150 MG tablet Commonly known as:  ZANTAC Take 1 tablet (150 mg total) by mouth 2 (two) times daily.        Review of Systems  Constitutional: Negative.   Respiratory: Negative.   Cardiovascular: Negative.   Psychiatric/Behavioral: Negative.        Objective:   Physical Exam  Constitutional: He is oriented to person, place, and time. He appears well-developed and well-nourished.  HENT:  Head: Normocephalic and atraumatic.  Eyes: Conjunctivae are normal. No scleral icterus.  Cardiovascular: Normal rate, regular rhythm and normal heart sounds.   Pulmonary/Chest:  Effort normal and breath sounds normal.  Neurological: He is alert and oriented to person, place, and time.  Skin: Skin is warm and dry.  Psychiatric: He has a normal mood and affect. His behavior is normal. Judgment and thought content normal.   BP (!) 153/91   Pulse (!) 111   Wt 251 lb (113.9 kg)   BMI 30.55 kg/m   Tenderness on lateral area on left nipple and is firmer than right Rash on left foot due to tight shoes/socks      Assessment & Plan:  Gynecomastia Medications refilled Visit diagnosis: contact dermatitis on foot  Follow up in 3 months

## 2016-02-13 NOTE — Patient Instructions (Signed)
3 month follow-up

## 2016-02-16 ENCOUNTER — Encounter: Payer: Self-pay | Admitting: Urgent Care

## 2016-02-16 ENCOUNTER — Emergency Department: Payer: Self-pay

## 2016-02-16 ENCOUNTER — Emergency Department
Admission: EM | Admit: 2016-02-16 | Discharge: 2016-02-16 | Disposition: A | Discharge status: Home / Self Care | Payer: Self-pay | Attending: Emergency Medicine | Admitting: Emergency Medicine

## 2016-02-16 DIAGNOSIS — S62515A Nondisplaced fracture of proximal phalanx of left thumb, initial encounter for closed fracture: Secondary | ICD-10-CM | POA: Insufficient documentation

## 2016-02-16 DIAGNOSIS — X58XXXA Exposure to other specified factors, initial encounter: Secondary | ICD-10-CM | POA: Insufficient documentation

## 2016-02-16 DIAGNOSIS — Y9372 Activity, wrestling: Secondary | ICD-10-CM | POA: Insufficient documentation

## 2016-02-16 DIAGNOSIS — S62501A Fracture of unspecified phalanx of right thumb, initial encounter for closed fracture: Secondary | ICD-10-CM

## 2016-02-16 DIAGNOSIS — F319 Bipolar disorder, unspecified: Secondary | ICD-10-CM | POA: Insufficient documentation

## 2016-02-16 DIAGNOSIS — Y998 Other external cause status: Secondary | ICD-10-CM | POA: Insufficient documentation

## 2016-02-16 DIAGNOSIS — Y929 Unspecified place or not applicable: Secondary | ICD-10-CM | POA: Insufficient documentation

## 2016-02-16 DIAGNOSIS — Z79899 Other long term (current) drug therapy: Secondary | ICD-10-CM | POA: Insufficient documentation

## 2016-02-16 MED ORDER — OXYCODONE-ACETAMINOPHEN 5-325 MG PO TABS: 1.0000 | ORAL_TABLET | Freq: Four times a day (QID) | ORAL | 0 refills | Status: DC | PRN

## 2016-02-16 MED ORDER — OXYCODONE-ACETAMINOPHEN 5-325 MG PO TABS
1.0000 | ORAL_TABLET | Freq: Once | ORAL | Status: AC
  Administered 2016-02-16: 1 via ORAL
  Filled 2016-02-16: qty 1

## 2016-02-16 NOTE — ED Triage Notes (Signed)
Patient presents with c/o LEFT thumb pain s/p injury while "wrestling". 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 Patient reports that he used "black tape" to splint the digit; removed  PTA; tape residue noted on hand. (+) swelling observed in triage.

## 2016-02-16 NOTE — ED Provider Notes (Signed)
[facility] Emergency Department Provider Note  ____________________________________________   I have reviewed the triage vital signs and the nursing notes.   HISTORY  Chief Complaint Finger Injury    HPI Wayne Brown is a 50 y.o. 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 After a wrestling match last night he started having pain in his thumb. He is not sure which wrestling maneuver wasn't acutely injured the thumb but he does have pain principally over the MCP. No other injury or complaint. No numbness no weakness.      Past Medical History:  Diagnosis Date  . Bipolar disorder (HCC)   . GERD (gastroesophageal reflux disease)   . Post traumatic stress disorder     Patient Active Problem List   Diagnosis Date Noted  . Breast pain, left 12/12/2015  . Shoulder pain, right 12/12/2015  . Alkaline phosphatase elevation 11/14/2015  . Atypical chest pain 11/14/2015  . PTSD (post-traumatic stress disorder) 11/14/2015  . Dysphagia 11/14/2015  . Abnormal weight gain 11/14/2015  . Acute gout 11/14/2015  . GERD (gastroesophageal reflux disease) 09/05/2015  . Pain and swelling of wrist 09/05/2015    History reviewed. No pertinent surgical history.  Current Outpatient Rx  . Order #: [id number] Class: Historical Med  . Order #: [id number] Class: Print  . Order #: [id number] Class: Normal  . Order #: [id number] Class: Normal  . Order #: [id number] Class: Print  . Order #: [id number] Class: Normal    Allergies Erythromycin; Toradol [ketorolac tromethamine]; Tramadol; Vicodin [hydrocodone-acetaminophen]; Lithium; and Penicillins  Family History  Problem Relation Age of Onset  . Cancer Father   . Cancer Mother   . Breast cancer Neg Hx     Social History Social History  Substance Use Topics  . Smoking status: Never Smoker  . Smokeless tobacco: Never Used  . Alcohol use No    Review of  Systems   ____________________________________________   PHYSICAL EXAM:  VITAL SIGNS: ED Triage Vitals [02/16/16 0426]  Enc Vitals Group     BP (!) 145/89     Pulse Rate 94     Resp 18     Temp 98.9 F (37.2 C)     Temp Source Oral     SpO2 97 %     Weight 252 lb (114.3 kg)     Height 6' 4.5" (1.943 m)     Head Circumference      Peak Flow      Pain Score 9     Pain Loc      Pain Edu?      Excl. in GC?     Constitutional: Alert and oriented. Well appearing and in no acute distress.  Musculoskeletal:There is palpation over the MCP with minimal swelling, patient can range the IP joint but the MCP is very tender. No ligamentous instability noted. Tenderness to palpation over the tendon there as well. No obvious tendon injury. He can flex and extend against resistance of the IP joint however, discomfort limits. Assessment of ligamentous function at the MCP joint. Strong pulses noted good cap refill   No lower extremity tenderness, no upper extremity tenderness. No joint effusions, no DVT signs strong distal pulses no edema Neurologic:  Normal speech and language. No gross focal neurologic deficits are appreciated.  Skin:  Skin is warm, dry and intact. No rash noted. Psychiatric: Mood and affect are normal. Speech and behavior are normal.  ____________________________________________   LABS (all labs ordered are listed, but only abnormal results are displayed)  Labs  Reviewed - No data to display ____________________________________________  EKG   ____________________________________________  RADIOLOGY  I reviewed any imaging ordered by me or triage that were performed during my shift and, if possible, patient and/or family made aware of any abnormal findings. ____________________________________________   PROCEDURES  Procedure(s) performed: None  Procedures  Critical Care performed: None  ____________________________________________   INITIAL IMPRESSION /  ASSESSMENT AND PLAN / ED COURSE  Pertinent labs & imaging results that were available during my care of the patient were reviewed by me and considered in my medical decision making (see chart for details).  Patient with tenderness over the MCP. X-ray shows fracture. We will limit thumb spica and have him follow-up. He understands that I cannot rule out ligamentous injury at this time given acute injury and discomfort. He knows he needs to follow up with orthopedic surgery. Patient states he suffers from nausea with tramadol and Vicodin and Toradol but can take Percocet with no difficulty with like a Percocet for this injury. I've advised him to limit his pain pill consumption as it is not good for him. Return precautions and follow-up given and understood no other acute injury noted  Clinical Course   ____________________________________________   FINAL CLINICAL IMPRESSION(S) / ED DIAGNOSES  Final diagnoses:  None      This chart was dictated using voice recognition software.  Despite best efforts to proofread,  errors can occur which can change meaning.      Jeanmarie Plant, MD 02/16/16 [phone]

## 2016-02-25 ENCOUNTER — Other Ambulatory Visit: Payer: Self-pay

## 2016-02-25 DIAGNOSIS — K219 Gastro-esophageal reflux disease without esophagitis: Secondary | ICD-10-CM

## 2016-02-25 MED ORDER — RANITIDINE HCL 150 MG PO TABS: 150.0000 mg | ORAL_TABLET | Freq: Two times a day (BID) | ORAL | 1 refills | Status: DC

## 2016-02-29 ENCOUNTER — Encounter: Payer: Self-pay | Admitting: Emergency Medicine

## 2016-02-29 ENCOUNTER — Emergency Department
Admission: EM | Admit: 2016-02-29 | Discharge: 2016-02-29 | Disposition: A | Discharge status: Home / Self Care | Payer: Self-pay | Attending: Emergency Medicine | Admitting: Emergency Medicine

## 2016-02-29 DIAGNOSIS — Z4789 Encounter for other orthopedic aftercare: Secondary | ICD-10-CM

## 2016-02-29 DIAGNOSIS — Z4689 Encounter for fitting and adjustment of other specified devices: Secondary | ICD-10-CM | POA: Insufficient documentation

## 2016-02-29 NOTE — ED Triage Notes (Addendum)
Pt reports he had a cast placed to his left forearm on 02/28/16 by [facility]. Pt reports tonight the cast is feeling tight along the mid forearm region. Pt concerned the cast should come off. Pt reports hx of PTSD and states he started "freaking out" about it and his girlfriend had to calm him down. Cap refill normal to all 5 digits of left hand.

## 2016-02-29 NOTE — ED Provider Notes (Signed)
[facility] Emergency Department Provider Note  ____________________________________________   First MD Initiated Contact with Patient 02/29/16 [phone]     (approximate)  I have reviewed the triage vital signs and the nursing notes.   HISTORY  Chief Complaint Other    HPI Wayne Brown is a 50 y.o. male presents with history of having a cast placed on his left forearm yesterday at her noted clinic. Patient admits to discomfort mid radial aspect of the forearm point tenderness under the cast.   Past Medical History:  Diagnosis Date  . Bipolar disorder (HCC)   . GERD (gastroesophageal reflux disease)   . Post traumatic stress disorder     Patient Active Problem List   Diagnosis Date Noted  . Breast pain, left 12/12/2015  . Shoulder pain, right 12/12/2015  . Alkaline phosphatase elevation 11/14/2015  . Atypical chest pain 11/14/2015  . PTSD (post-traumatic stress disorder) 11/14/2015  . Dysphagia 11/14/2015  . Abnormal weight gain 11/14/2015  . Acute gout 11/14/2015  . GERD (gastroesophageal reflux disease) 09/05/2015  . Pain and swelling of wrist 09/05/2015    History reviewed. No pertinent surgical history.  Prior to Admission medications   Medication Sig Start Date End Date Taking? Authorizing Provider  clonazePAM (KLONOPIN) 0.5 MG tablet Take 0.5 mg by mouth 2 (two) times daily.   Yes Historical Provider, MD  fexofenadine (ALLEGRA ALLERGY) 180 MG tablet Take 1 tablet (180 mg total) by mouth daily. PRN allergies 11/19/15  Yes Shannon A McGowan, PA-C  furosemide (LASIX) 20 MG tablet Take 1 tablet (20 mg total) by mouth as needed (per pt). 11/19/15  Yes Shannon A McGowan, PA-C  hydrOXYzine (ATARAX/VISTARIL) 10 MG tablet Take 1 tablet (10 mg total) by mouth 3 (three) times daily as needed (unknown dose per pt). Patient taking differently: Take 10 mg by mouth 2 (two) times daily.  11/19/15  Yes Shannon A McGowan, PA-C  ranitidine (ZANTAC) 150 MG  tablet Take 1 tablet (150 mg total) by mouth 2 (two) times daily. 02/25/16  Yes Virl Axeon C Chaplin, MD  meloxicam (MOBIC) 15 MG tablet Take 1 tablet (15 mg total) by mouth daily. 12/12/15   Lorie PhenixNancy Maloney, MD  oxyCODONE-acetaminophen (ROXICET) 5-325 MG tablet Take 1 tablet by mouth every 6 (six) hours as needed. 02/16/16   Jeanmarie PlantJames A McShane, MD    Allergies Erythromycin; Toradol [ketorolac tromethamine]; Tramadol; Vicodin [hydrocodone-acetaminophen]; Lithium; and Penicillins  Family History  Problem Relation Age of Onset  . Cancer Father   . Cancer Mother   . Breast cancer Neg Hx     Social History Social History  Substance Use Topics  . Smoking status: Never Smoker  . Smokeless tobacco: Never Used  . Alcohol use No    Review of Systems Constitutional: No fever/chills Eyes: No visual changes. ENT: No sore throat. Cardiovascular: Denies chest pain. Respiratory: Denies shortness of breath. Gastrointestinal: No abdominal pain.  No nausea, no vomiting.  No diarrhea.  No constipation. Genitourinary: Negative for dysuria. Musculoskeletal: Negative for back pain. Skin: Negative for rash. Neurological: Negative for headaches, focal weakness or numbness.  10-point ROS otherwise negative.  ____________________________________________   PHYSICAL EXAM:  VITAL SIGNS: ED Triage Vitals  Enc Vitals Group     BP 02/29/16 0252 (!) 153/94     Pulse Rate 02/29/16 0252 91     Resp 02/29/16 0252 18     Temp 02/29/16 0252 97.8 F (36.6 C)     Temp Source 02/29/16 0252 Oral  SpO2 02/29/16 0252 98 %     Weight 02/29/16 0252 251 lb (113.9 kg)     Height 02/29/16 0252  (1.93 m)     Head Circumference --      Peak Flow --      Pain Score 02/29/16 0300 7     Pain Loc --      Pain Edu? --      Excl. in GC? --     Constitutional: Alert and oriented. Well appearing and in no acute distress. Eyes: Conjunctivae are normal. PERRL. EOMI. Head: Atraumatic. Musculoskeletal: No lower extremity  tenderness nor edema. No gross deformities of extremities. No gross abnormality noted with a cast able to introduce 2 fingers without any difficulty under the cast. Cap refill less than 2 seconds neurologically intact. Neurologic:  Normal speech and language. No gross focal neurologic deficits are appreciated.  Skin:  Skin is warm, dry and intact. No rash noted. Psychiatric: Mood and affect are normal. Speech and behavior are normal.  ____________________________________________     Labs Reviewed - No data to display   No results found.  ____________________________________________    Procedures     INITIAL IMPRESSION / ASSESSMENT AND PLAN / ED COURSE  Pertinent labs & imaging results that were available during my care of the patient were reviewed by me and considered in my medical decision making (see chart for details).  Patient advised to [facility]-up with [facility] this morning for further evaluation of possible irregularity in the cast which may require removal and reapplication.  Clinical Course    ____________________________________________  FINAL CLINICAL IMPRESSION(S) / ED DIAGNOSES  Final diagnoses:  Cast discomfort     MEDICATIONS GIVEN DURING THIS VISIT:  Medications - No data to display   NEW OUTPATIENT MEDICATIONS STARTED DURING THIS VISIT:  New Prescriptions   No medications on file      Note:  This document was prepared using Dragon voice recognition software and may include unintentional dictation errors.     Darci Current, MD 02/29/16 [phone]

## 2016-03-01 ENCOUNTER — Emergency Department
Admission: EM | Admit: 2016-03-01 | Discharge: 2016-03-01 | Disposition: A | Discharge status: Home / Self Care | Payer: Self-pay | Attending: Emergency Medicine | Admitting: Emergency Medicine

## 2016-03-01 ENCOUNTER — Encounter: Payer: Self-pay | Admitting: Urgent Care

## 2016-03-01 DIAGNOSIS — Z79899 Other long term (current) drug therapy: Secondary | ICD-10-CM | POA: Insufficient documentation

## 2016-03-01 DIAGNOSIS — Z4689 Encounter for fitting and adjustment of other specified devices: Secondary | ICD-10-CM

## 2016-03-01 NOTE — ED Triage Notes (Signed)
Patient presents tonight with c/o LEFT wrist pain. Of note, patient has been seen here x 2 for the same. Of note, patient has cast in place. He was seen last night here requesting for the cast to be removed; "they wouldn't cut it off like I asked". Patient presents today stating, "I am back again. This time if you don't cut it off, I am going to go home and cut it off myself". Patient neurovascularly intact when LFA assessed; digits of a normal color, hand warm and dry, and capillary refill < 3 seconds.

## 2016-03-01 NOTE — ED Provider Notes (Signed)
[facility] Emergency Department Provider Note  ____________________________________________   First MD Initiated Contact with Patient 03/01/16 0154     (approximate)  I have reviewed the triage vital signs and the nursing notes.   HISTORY  Chief Complaint Wrist Pain    HPI Wayne Brown is a 50 y.o. male seen in the emergency department yesterday with request to have his cast removed returns tonight with the same request. Patient was seen and evaluated yesterday with a request to have his cast removed. Patient was advised to follow-up with orthopedic surgery today which she states that he called however no one answered the phone.   Past Medical History:  Diagnosis Date  . Bipolar disorder (HCC)   . GERD (gastroesophageal reflux disease)   . Post traumatic stress disorder     Patient Active Problem List   Diagnosis Date Noted  . Breast pain, left 12/12/2015  . Shoulder pain, right 12/12/2015  . Alkaline phosphatase elevation 11/14/2015  . Atypical chest pain 11/14/2015  . PTSD (post-traumatic stress disorder) 11/14/2015  . Dysphagia 11/14/2015  . Abnormal weight gain 11/14/2015  . Acute gout 11/14/2015  . GERD (gastroesophageal reflux disease) 09/05/2015  . Pain and swelling of wrist 09/05/2015    History reviewed. No pertinent surgical history.  Prior to Admission medications   Medication Sig Start Date End Date Taking? Authorizing Provider  clonazePAM (KLONOPIN) 0.5 MG tablet Take 0.5 mg by mouth 2 (two) times daily.    Historical Provider, MD  fexofenadine (ALLEGRA ALLERGY) 180 MG tablet Take 1 tablet (180 mg total) by mouth daily. PRN allergies 11/19/15   Harle BattiestShannon A McGowan, PA-C  furosemide (LASIX) 20 MG tablet Take 1 tablet (20 mg total) by mouth as needed (per pt). 11/19/15   Harle BattiestShannon A McGowan, PA-C  hydrOXYzine (ATARAX/VISTARIL) 10 MG tablet Take 1 tablet (10 mg total) by mouth 3 (three) times daily as needed (unknown dose per  pt). Patient taking differently: Take 10 mg by mouth 2 (two) times daily.  11/19/15   Harle BattiestShannon A McGowan, PA-C  meloxicam (MOBIC) 15 MG tablet Take 1 tablet (15 mg total) by mouth daily. 12/12/15   Lorie PhenixNancy Maloney, MD  oxyCODONE-acetaminophen (ROXICET) 5-325 MG tablet Take 1 tablet by mouth every 6 (six) hours as needed. 02/16/16   Jeanmarie PlantJames A McShane, MD  ranitidine (ZANTAC) 150 MG tablet Take 1 tablet (150 mg total) by mouth 2 (two) times daily. 02/25/16   Virl Axeon C Chaplin, MD    Allergies Erythromycin; Toradol [ketorolac tromethamine]; Tramadol; Vicodin [hydrocodone-acetaminophen]; Lithium; and Penicillins  Family History  Problem Relation Age of Onset  . Cancer Father   . Cancer Mother   . Breast cancer Neg Hx     Social History Social History  Substance Use Topics  . Smoking status: Never Smoker  . Smokeless tobacco: Never Used  . Alcohol use No    Review of Systems Constitutional: No fever/chills Eyes: No visual changes. ENT: No sore throat. Cardiovascular: Denies chest pain. Respiratory: Denies shortness of breath. Gastrointestinal: No abdominal pain.  No nausea, no vomiting.  No diarrhea.  No constipation. Genitourinary: Negative for dysuria. Musculoskeletal: Negative for back pain. Skin: Negative for rash. Neurological: Negative for headaches, focal weakness or numbness.  10-point ROS otherwise negative.  ____________________________________________   PHYSICAL EXAM:  VITAL SIGNS: ED Triage Vitals  Enc Vitals Group     BP 03/01/16 0115 132/84     Pulse Rate 03/01/16 0115 86     Resp 03/01/16 0115 18  Temp 03/01/16 0115 98.2 F (36.8 C)     Temp Source 03/01/16 0115 Oral     SpO2 03/01/16 0115 95 %     Weight 03/01/16 0106 251 lb (113.9 kg)     Height 03/01/16 0106  (1.93 m)     Head Circumference --      Peak Flow --      Pain Score 03/01/16 0107 5     Pain Loc --      Pain Edu? --      Excl. in GC? --     Constitutional: Alert and oriented. Well  appearing and in no acute distress. Eyes: Conjunctivae are normal. PERRL. EOMI. Head: Atraumatic. Mouth/Throat: Mucous membranes are moist.  Oropharynx non-erythematous. Neck: No stridor.  No meningeal signs.   Cardiovascular: Normal rate, regular rhythm. Good peripheral circulation. Grossly normal heart sounds.   Respiratory: Normal respiratory effort.  No retractions. Lungs CTAB. Gastrointestinal: Soft and nontender. No distention.  Musculoskeletal: No lower extremity tenderness nor edema. No gross deformities of extremities. Cap refill less than 2 seconds able to introduce 2 fingers under the cast without any difficulty Neurologic:  Normal speech and language. No gross focal neurologic deficits are appreciated.  Skin:  Skin is warm, dry and intact. No rash noted.   ____________________________________________   LABS (all labs ordered are listed, but only abnormal results are displayed)  Labs Reviewed - No data to display _    Procedures    INITIAL IMPRESSION / ASSESSMENT AND PLAN / ED COURSE  Pertinent labs & imaging results that were available during my care of the patient were reviewed by me and considered in my medical decision making (see chart for details).  Patient stated that if we did not remove the cast that he would "go home and cut it off". Given potential risks to the patient cast was removed in the emergency department.  Clinical Course    ____________________________________________  FINAL CLINICAL IMPRESSION(S) / ED DIAGNOSES  Final diagnoses:  Cast removal     MEDICATIONS GIVEN DURING THIS VISIT:  Medications - No data to display   NEW OUTPATIENT MEDICATIONS STARTED DURING THIS VISIT:  New Prescriptions   No medications on file      Note:  This document was prepared using Dragon voice recognition software and may include unintentional dictation errors.    Darci Current, MD 03/01/16 [phone]

## 2016-03-01 NOTE — ED Notes (Signed)
Thumb spica hard cast removed from LEFT wrist by Dr. Manson PasseyBrown with assistance from this RN with minimal difficulty using motorized cast saw. Patient tolerated procedure well and communicated no reports of increased pain during the removal of the cast. Skin inspected after cast removed; intact. Hand warm and dry. Continues to experience point tenderness over distal radius; MD aware.

## 2016-03-03 ENCOUNTER — Other Ambulatory Visit: Payer: Self-pay | Admitting: Nurse Practitioner

## 2016-03-03 DIAGNOSIS — K219 Gastro-esophageal reflux disease without esophagitis: Secondary | ICD-10-CM

## 2016-05-21 ENCOUNTER — Ambulatory Visit: Payer: Self-pay

## 2016-05-28 ENCOUNTER — Ambulatory Visit: Payer: Self-pay | Admitting: Adult Health Nurse Practitioner

## 2016-05-28 VITALS — BP 150/94 | HR 87 | Temp 97.9°F | Ht 76.0 in | Wt 250.0 lb

## 2016-05-28 DIAGNOSIS — G2581 Restless legs syndrome: Secondary | ICD-10-CM

## 2016-05-28 DIAGNOSIS — Z0000 Encounter for general adult medical examination without abnormal findings: Secondary | ICD-10-CM

## 2016-05-28 DIAGNOSIS — M79672 Pain in left foot: Secondary | ICD-10-CM | POA: Insufficient documentation

## 2016-05-28 DIAGNOSIS — H9201 Otalgia, right ear: Secondary | ICD-10-CM

## 2016-05-28 MED ORDER — ROPINIROLE HCL 0.25 MG PO TABS: 0.2500 mg | ORAL_TABLET | Freq: Every day | ORAL | 0 refills | Status: DC

## 2016-05-28 NOTE — Progress Notes (Signed)
Patient: Wayne BarsCharles D Brown Male    DOB: 08/04/1965   50 y.o.   MRN: [id number] Visit Date: 05/28/2016  Today's Provider: Jacelyn Pieah Doles-Johnson, NP   Chief Complaint  Patient presents with  . Foreign Body in Ear    R ear "crawling sensation" in ear x1 mo   . Foot Pain    L heel tenderness, difficulty walking    Subjective:    HPI   R EAR:  No pain or aching.  "Feels like a bug is crawling in his ear." Awakens him at night.  No hearing problems.  Denies trauma.    L FOOT:  Tender to walk on.  Throbs at night.  Tenderness decreases as the day progresses.  Denies bone spurs.  Not painful-just tender.  Denies injuries.  Denies swelling, redness, warmth.  Has not tried any interventions.  Does pro-wrestling could have hit it at anytime.   States that his legs jump at nighttime and his knees ache. States that icy hot helps.  Has never been on medications for icy hot.  Has taken gabapentin and it made his night terrors worse.    BP elevated today- last OV BP was 136/86.   Allergies  Allergen Reactions  . Erythromycin Other (See Comments)    Depression per pt  . Toradol [Ketorolac Tromethamine] Nausea And Vomiting  . Tramadol Nausea And Vomiting  . Vicodin [Hydrocodone-Acetaminophen] Nausea And Vomiting  . Lithium Rash    "lithium rash" per pt  . Penicillins Rash    Per pt rxn was an infant, familial   Previous Medications   CLONAZEPAM (KLONOPIN) 0.5 MG TABLET    Take 0.5 mg by mouth 2 (two) times daily.   FEXOFENADINE (ALLEGRA ALLERGY) 180 MG TABLET    Take 1 tablet (180 mg total) by mouth daily. PRN allergies   FUROSEMIDE (LASIX) 20 MG TABLET    Take 1 tablet (20 mg total) by mouth as needed (per pt).   HYDROXYZINE (ATARAX/VISTARIL) 10 MG TABLET    Take 1 tablet (10 mg total) by mouth 3 (three) times daily as needed (unknown dose per pt).   MELOXICAM (MOBIC) 15 MG TABLET    Take 1 tablet (15 mg total) by mouth daily.   OXYCODONE-ACETAMINOPHEN (ROXICET) 5-325 MG  TABLET    Take 1 tablet by mouth every 6 (six) hours as needed.   RANITIDINE (ZANTAC) 150 MG TABLET    TAKE TWO TABLETS BY MOUTH 2 TIMES A DAY    Review of Systems  All other systems reviewed and are negative.   Social History  Substance Use Topics  . Smoking status: Never Smoker  . Smokeless tobacco: Never Used  . Alcohol use No   Objective:   BP (!) 150/94 (BP Location: Left Arm, Patient Position: Sitting)   Pulse 87   Temp 97.9 F (36.6 C)   Ht 6\' 4"  (1.93 m)   Wt 250 lb (113.4 kg)   SpO2 98%   BMI 30.43 kg/m   Physical Exam  Constitutional: He is oriented to person, place, and time. He appears well-developed and well-nourished.  HENT:  Head: Normocephalic and atraumatic.  Neck: Normal range of motion. Neck supple.  Cardiovascular: Normal rate, regular rhythm and normal heart sounds.   Pulmonary/Chest: Effort normal and breath sounds normal.  Abdominal: Soft. Bowel sounds are normal.  Musculoskeletal:  Slight tenderness to palpation of the L heel- no redness or warmth. No open areas or drainage.   Neurological: He is alert and oriented to  person, place, and time.  Skin: Skin is warm and dry.  Psychiatric: He has a normal mood and affect.        Assessment & Plan:          Monitor BP at next ov. Elevated today.  Will order routine lab work.   RLS/FOOT PAIN: Ivory soap under the sheets at bedtime.  Start requip.  Soak in Epsom salts. Rub with Icy hot and elevated.  Warm compress.  Insoles in shoes.    EAR PAIN:  Switch bluetooth to other ear.  Monitor.  Debrox drops for wax.  Do not put anything in the the ear.      Jacelyn Pieah Doles-Johnson, NP   [facility]

## 2016-06-04 ENCOUNTER — Other Ambulatory Visit: Payer: Self-pay

## 2016-06-18 ENCOUNTER — Other Ambulatory Visit: Payer: Self-pay

## 2016-06-23 ENCOUNTER — Other Ambulatory Visit: Payer: Self-pay

## 2016-06-25 ENCOUNTER — Ambulatory Visit: Payer: Self-pay | Admitting: Adult Health Nurse Practitioner

## 2016-06-25 ENCOUNTER — Other Ambulatory Visit: Payer: Self-pay

## 2016-06-25 ENCOUNTER — Ambulatory Visit
Admission: RE | Admit: 2016-06-25 | Discharge: 2016-06-25 | Disposition: A | Discharge status: Home / Self Care | Payer: Self-pay | Attending: Family Medicine | Admitting: Family Medicine

## 2016-06-25 ENCOUNTER — Ambulatory Visit
Admission: RE | Admit: 2016-06-25 | Discharge: 2016-06-25 | Disposition: A | Discharge status: Home / Self Care | Payer: Self-pay | Source: Ambulatory Visit | Attending: Adult Health Nurse Practitioner | Admitting: Adult Health Nurse Practitioner

## 2016-06-25 VITALS — BP 141/92 | HR 83 | Ht 77.0 in | Wt 238.0 lb

## 2016-06-25 DIAGNOSIS — M79672 Pain in left foot: Secondary | ICD-10-CM

## 2016-06-25 DIAGNOSIS — Z0000 Encounter for general adult medical examination without abnormal findings: Secondary | ICD-10-CM

## 2016-06-25 DIAGNOSIS — H9201 Otalgia, right ear: Secondary | ICD-10-CM

## 2016-06-25 DIAGNOSIS — I10 Essential (primary) hypertension: Secondary | ICD-10-CM

## 2016-06-25 MED ORDER — LISINOPRIL 10 MG PO TABS: 10.0000 mg | ORAL_TABLET | Freq: Every day | ORAL | 2 refills | Status: DC

## 2016-06-25 NOTE — Progress Notes (Signed)
Patient: Wayne BarsCharles D Stoffel Male    DOB: 1965-10-10   50 y.o.   MRN: [id number] Visit Date: 06/25/2016  Today's Provider: [facility]   Chief Complaint  Patient presents with  . Follow-up    ear discomfirt and possible bone spurs on left foot, left heel tenderness some relief with ambulation   Subjective:    HPI  Pt still with L foot pain.  Pain with walking- pain eases with walking but never goes completely away.  Rates 7/10 at worst.  Denies injury.  Pt states it hurts less when wearing slides vs when wearing closed in shoes.    R ear:  Still having pain but lessened. Not as often as it was. Has improved.  Denies hearing problems.,  Denies drainage.   BP continues to be elevated.  Takes lasix PRN- averaging every 3-4 weeks.      Allergies  Allergen Reactions  . Erythromycin Other (See Comments)    Depression per pt  . Toradol [Ketorolac Tromethamine] Nausea And Vomiting  . Tramadol Nausea And Vomiting  . Vicodin [Hydrocodone-Acetaminophen] Nausea And Vomiting  . Lithium Rash    "lithium rash" per pt  . Penicillins Rash    Per pt rxn was an infant, familial   Previous Medications   CLONAZEPAM (KLONOPIN) 0.5 MG TABLET    Take 0.5 mg by mouth 2 (two) times daily.   FEXOFENADINE (ALLEGRA ALLERGY) 180 MG TABLET    Take 1 tablet (180 mg total) by mouth daily. PRN allergies   FUROSEMIDE (LASIX) 20 MG TABLET    Take 1 tablet (20 mg total) by mouth as needed (per pt).   HYDROXYZINE (ATARAX/VISTARIL) 10 MG TABLET    Take 1 tablet (10 mg total) by mouth 3 (three) times daily as needed (unknown dose per pt).   MELOXICAM (MOBIC) 15 MG TABLET    Take 1 tablet (15 mg total) by mouth daily.   OXYCODONE-ACETAMINOPHEN (ROXICET) 5-325 MG TABLET    Take 1 tablet by mouth every 6 (six) hours as needed.   RANITIDINE (ZANTAC) 150 MG TABLET    TAKE TWO TABLETS BY MOUTH 2 TIMES A DAY   ROPINIROLE (REQUIP) 0.25 MG TABLET    Take 1 tablet (0.25 mg total) by mouth at bedtime.      Review of Systems  Social History  Substance Use Topics  . Smoking status: Never Smoker  . Smokeless tobacco: Never Used  . Alcohol use 0.0 oz/week     Comment: 1 or 2 beers per 6 mo    Objective:   BP (!) 141/92   Pulse 83   Ht 6\' 5"  (1.956 m)   Wt 238 lb (108 kg)   SpO2 94%   BMI 28.22 kg/m   Physical Exam  Constitutional: He is oriented to person, place, and time. He appears well-developed and well-nourished.  HENT:  Head: Normocephalic and atraumatic.  Eyes: Pupils are equal, round, and reactive to light.  Neck: Normal range of motion. Neck supple.  Cardiovascular: Normal rate, regular rhythm and normal heart sounds.   Pulmonary/Chest: Effort normal and breath sounds normal.  Abdominal: Bowel sounds are normal.  Musculoskeletal:  L Foot nontender to touch. No redness or warmth. No edema. No apparent injury.   Neurological: He is alert and oriented to person, place, and time.  Skin: Skin is warm and dry.  Psychiatric: He has a normal mood and affect.  Vitals reviewed.       Assessment & Plan:  HTN:  New diagnosis.  Start lisinopril 10mg  daily.  Monitor BP as able.  Low sodium diet.    R ear pain:  Try Debrox drops.  Use saline drops in ear.  Monitor for worsening.  If worsens will send to ENT.   L Foot pain:  Will order xray.    Pt to get labs today.  FU in 1 month for BP check and foot pain.       [facility]   [facility] [place]

## 2016-06-26 LAB — HEMOGLOBIN A1C
Est. average glucose Bld gHb Est-mCnc: 117 mg/dL
HEMOGLOBIN A1C: 5.7 % — AB (ref 4.8–5.6)

## 2016-06-26 LAB — COMPREHENSIVE METABOLIC PANEL
A/G RATIO: 1.6 (ref 1.2–2.2)
ALK PHOS: 143 IU/L — AB (ref 39–117)
ALT: 25 IU/L (ref 0–44)
AST: 15 IU/L (ref 0–40)
Albumin: 4.2 g/dL (ref 3.5–5.5)
BILIRUBIN TOTAL: 0.5 mg/dL (ref 0.0–1.2)
BUN/Creatinine Ratio: 7 — ABNORMAL LOW (ref 9–20)
BUN: 8 mg/dL (ref 6–24)
CHLORIDE: 103 mmol/L (ref 96–106)
CO2: 25 mmol/L (ref 18–29)
Calcium: 8.8 mg/dL (ref 8.7–10.2)
Creatinine, Ser: 1.07 mg/dL (ref 0.76–1.27)
GFR calc non Af Amer: 82 mL/min/{1.73_m2} (ref >59)
GFR, EST AFRICAN AMERICAN: 94 mL/min/{1.73_m2} (ref >59)
Globulin, Total: 2.7 g/dL (ref 1.5–4.5)
Glucose: 87 mg/dL (ref 65–99)
POTASSIUM: 3.7 mmol/L (ref 3.5–5.2)
Sodium: 142 mmol/L (ref 134–144)
Total Protein: 6.9 g/dL (ref 6.0–8.5)

## 2016-06-26 LAB — CBC WITH DIFFERENTIAL
BASOS: 1 %
Basophils Absolute: 0.1 10*3/uL (ref 0.0–0.2)
EOS (ABSOLUTE): 0.3 10*3/uL (ref 0.0–0.4)
Eos: 4 %
Hematocrit: 40.6 % (ref 37.5–51.0)
Hemoglobin: 13.8 g/dL (ref 13.0–17.7)
Immature Grans (Abs): 0 10*3/uL (ref 0.0–0.1)
Immature Granulocytes: 0 %
Lymphocytes Absolute: 2.1 10*3/uL (ref 0.7–3.1)
Lymphs: 26 %
MCH: 30.3 pg (ref 26.6–33.0)
MCHC: 34 g/dL (ref 31.5–35.7)
MCV: 89 fL (ref 79–97)
MONOS ABS: 0.6 10*3/uL (ref 0.1–0.9)
Monocytes: 8 %
NEUTROS ABS: 5.1 10*3/uL (ref 1.4–7.0)
NEUTROS PCT: 61 %
RBC: 4.55 x10E6/uL (ref 4.14–5.80)
RDW: 13.5 % (ref 12.3–15.4)
WBC: 8.2 10*3/uL (ref 3.4–10.8)

## 2016-06-26 LAB — LIPID PANEL
Chol/HDL Ratio: 4.3 ratio units (ref 0.0–5.0)
Cholesterol, Total: 138 mg/dL (ref 100–199)
HDL: 32 mg/dL — AB (ref >39)
LDL Calculated: 89 mg/dL (ref 0–99)
TRIGLYCERIDES: 85 mg/dL (ref 0–149)
VLDL Cholesterol Cal: 17 mg/dL (ref 5–40)

## 2016-06-26 LAB — TSH: TSH: 2.87 u[IU]/mL (ref 0.450–4.500)

## 2016-07-01 ENCOUNTER — Telehealth: Payer: Self-pay

## 2016-07-01 NOTE — Telephone Encounter (Signed)
-----   Message from Jacelyn Pieah Doles-Johnson, NP sent at 06/30/2016  8:23 PM EST ----- Foot Xray negative.

## 2016-07-01 NOTE — Telephone Encounter (Signed)
Called pt to give results from doctor. Pt verbalized understanding.

## 2016-07-10 ENCOUNTER — Other Ambulatory Visit: Payer: Self-pay

## 2016-07-10 DIAGNOSIS — K219 Gastro-esophageal reflux disease without esophagitis: Secondary | ICD-10-CM

## 2016-07-10 MED ORDER — RANITIDINE HCL 150 MG PO TABS: ORAL_TABLET | ORAL | 0 refills | Status: DC

## 2016-07-10 NOTE — Telephone Encounter (Signed)
Pt called in needing refill on ranitidine. Refill sent

## 2016-07-23 ENCOUNTER — Ambulatory Visit: Payer: Self-pay | Admitting: Adult Health Nurse Practitioner

## 2016-07-23 DIAGNOSIS — I10 Essential (primary) hypertension: Secondary | ICD-10-CM | POA: Insufficient documentation

## 2016-07-23 NOTE — Progress Notes (Signed)
  Patient: Wayne Brown Male    DOB: 1966/05/19   51 y.o.   MRN: [id number] Visit Date: 07/23/2016  Today's Provider: [facility]   Chief Complaint  Patient presents with  . Follow-up  . Stye    left eye   Subjective:    HPI   FU for HTN- newly diagnosed.  Started on lisinopril 10mg  at last OV.    Stye has been present for 3 days.  Pt has been putting warm compresses on eye.        Allergies  Allergen Reactions  . Erythromycin Other (See Comments)    Depression per pt  . Toradol [Ketorolac Tromethamine] Nausea And Vomiting  . Tramadol Nausea And Vomiting  . Vicodin [Hydrocodone-Acetaminophen] Nausea And Vomiting  . Lithium Rash    "lithium rash" per pt  . Penicillins Rash    Per pt rxn was an infant, familial   Previous Medications   CLONAZEPAM (KLONOPIN) 0.5 MG TABLET    Take 0.5 mg by mouth 2 (two) times daily.   FUROSEMIDE (LASIX) 20 MG TABLET    Take 1 tablet (20 mg total) by mouth as needed (per pt).   LISINOPRIL (PRINIVIL,ZESTRIL) 10 MG TABLET    Take 1 tablet (10 mg total) by mouth daily.   MELOXICAM (MOBIC) 15 MG TABLET    Take 1 tablet (15 mg total) by mouth daily.   RANITIDINE (ZANTAC) 150 MG TABLET    TAKE TWO TABLETS BY MOUTH 2 TIMES A DAY   ROPINIROLE (REQUIP) 0.25 MG TABLET    Take 1 tablet (0.25 mg total) by mouth at bedtime.    Review of Systems  All other systems reviewed and are negative.   Social History  Substance Use Topics  . Smoking status: Never Smoker  . Smokeless tobacco: Never Used  . Alcohol use 0.0 oz/week     Comment: 1 or 2 beers per 6 mo    Objective:   BP 124/84   Pulse 77   Wt 226 lb (102.5 kg)   BMI 26.80 kg/m   Physical Exam  Constitutional: He is oriented to person, place, and time. He appears well-developed and well-nourished.  HENT:  Head: Normocephalic and atraumatic.  Eyes: Pupils are equal, round, and reactive to light. Left eye exhibits no discharge.  Hordeolum present on the L lower eye  lid. With slight redness. No drainage.   Cardiovascular: Normal rate, regular rhythm and normal heart sounds.   Pulmonary/Chest: Effort normal and breath sounds normal.  Abdominal: Soft. Bowel sounds are normal.  Neurological: He is alert and oriented to person, place, and time.  Psychiatric: He has a normal mood and affect. His behavior is normal.  Vitals reviewed.       Assessment & Plan:        HTN:  Much improved from last visit.  Continue lisinopril 10mg  daily.   Stye:  Dont rub.  Keep it clean and dry.  FU if not improved in a week.   [facility]   [facility] [place]

## 2016-08-13 ENCOUNTER — Emergency Department
Admission: EM | Admit: 2016-08-13 | Discharge: 2016-08-13 | Disposition: A | Discharge status: Home / Self Care | Payer: Self-pay | Attending: Emergency Medicine | Admitting: Emergency Medicine

## 2016-08-13 ENCOUNTER — Encounter: Payer: Self-pay | Admitting: Emergency Medicine

## 2016-08-13 ENCOUNTER — Emergency Department: Payer: Self-pay

## 2016-08-13 DIAGNOSIS — M5412 Radiculopathy, cervical region: Secondary | ICD-10-CM | POA: Insufficient documentation

## 2016-08-13 DIAGNOSIS — I10 Essential (primary) hypertension: Secondary | ICD-10-CM | POA: Insufficient documentation

## 2016-08-13 DIAGNOSIS — Z79899 Other long term (current) drug therapy: Secondary | ICD-10-CM | POA: Insufficient documentation

## 2016-08-13 MED ORDER — OXYCODONE-ACETAMINOPHEN 5-325 MG PO TABS: 1.0000 | ORAL_TABLET | Freq: Four times a day (QID) | ORAL | 0 refills | Status: DC | PRN

## 2016-08-13 MED ORDER — PREDNISONE 10 MG (21) PO TBPK: ORAL_TABLET | ORAL | 0 refills | Status: DC

## 2016-08-13 MED ORDER — OXYCODONE-ACETAMINOPHEN 5-325 MG PO TABS
1.0000 | ORAL_TABLET | Freq: Once | ORAL | Status: AC
  Administered 2016-08-13: 1 via ORAL
  Filled 2016-08-13: qty 1

## 2016-08-13 MED ORDER — PREDNISONE 20 MG PO TABS
60.0000 mg | ORAL_TABLET | Freq: Once | ORAL | Status: AC
  Administered 2016-08-13: 60 mg via ORAL
  Filled 2016-08-13: qty 3

## 2016-08-13 NOTE — ED Provider Notes (Signed)
[facility] Emergency Department Provider Note ____________________________________________  Time seen: Approximately 6:50 PM  I have reviewed the triage vital signs and the nursing notes.   HISTORY  Chief Complaint Shoulder Pain    HPI Wayne Brown is a 51 y.o. male who presents to the emergency department for evaluation of right shoulder pain that has been present for the past 2 weeks. No specific injury. Pain radiates into left hand and feels it gets weaker than the left occasionally. No relief with ibuprofen at home.  Past Medical History:  Diagnosis Date  . Bipolar disorder (HCC)   . GERD (gastroesophageal reflux disease)   . Post traumatic stress disorder     Patient Active Problem List   Diagnosis Date Noted  . Hypertension 07/23/2016  . Restless leg syndrome 05/28/2016  . Foot pain, left 05/28/2016  . Ear pain, right 05/28/2016  . Breast pain, left 12/12/2015  . Shoulder pain, right 12/12/2015  . Alkaline phosphatase elevation 11/14/2015  . Atypical chest pain 11/14/2015  . PTSD (post-traumatic stress disorder) 11/14/2015  . Dysphagia 11/14/2015  . Abnormal weight gain 11/14/2015  . Acute gout 11/14/2015  . GERD (gastroesophageal reflux disease) 09/05/2015  . Pain and swelling of wrist 09/05/2015    History reviewed. No pertinent surgical history.  Prior to Admission medications   Medication Sig Start Date End Date Taking? Authorizing Provider  clonazePAM (KLONOPIN) 0.5 MG tablet Take 0.5 mg by mouth 2 (two) times daily.    Historical Provider, MD  furosemide (LASIX) 20 MG tablet Take 1 tablet (20 mg total) by mouth as needed (per pt). 11/19/15   Carollee Herter A McGowan, PA-C  lisinopril (PRINIVIL,ZESTRIL) 10 MG tablet Take 1 tablet (10 mg total) by mouth daily. 06/25/16   Teah Doles-Johnson, NP  oxyCODONE-acetaminophen (ROXICET) 5-325 MG tablet Take 1 tablet by mouth every 6 (six) hours as needed. 08/13/16   Chinita Pester, FNP   predniSONE (STERAPRED UNI-PAK 21 TAB) 10 MG (21) TBPK tablet Take 6 tablets on day 1 Take 5 tablets on day 2 Take 4 tablets on day 3 Take 3 tablets on day 4 Take 2 tablets on day 5 Take 1 tablet on day 6 08/13/16   Chinita Pester, FNP  ranitidine (ZANTAC) 150 MG tablet TAKE TWO TABLETS BY MOUTH 2 TIMES A DAY 07/10/16   Harle Battiest, PA-C    Allergies Erythromycin; Toradol [ketorolac tromethamine]; Tramadol; Vicodin [hydrocodone-acetaminophen]; Lithium; and Penicillins  Family History  Problem Relation Age of Onset  . Cancer Father   . Cancer Mother   . Breast cancer Neg Hx     Social History Social History  Substance Use Topics  . Smoking status: Never Smoker  . Smokeless tobacco: Never Used  . Alcohol use 0.0 oz/week     Comment: 1 or 2 beers per 6 mo     Review of Systems Constitutional: No recent illness. Cardiovascular: Denies chest pain or palpitations. Respiratory: Denies shortness of breath. Musculoskeletal: Pain in right shoulder Skin: Negative for rash, wound, lesion. Neurological: Negative for focal weakness or numbness.  ____________________________________________   PHYSICAL EXAM:  VITAL SIGNS: ED Triage Vitals [08/13/16 1826]  Enc Vitals Group     BP 137/76     Pulse Rate 69     Resp 17     Temp 98 F (36.7 C)     Temp Source Oral     SpO2 96 %     Weight 245 lb (111.1 kg)  Height 6\' 5"  (1.956 m)     Head Circumference      Peak Flow      Pain Score 7     Pain Loc      Pain Edu?      Excl. in GC?     Constitutional: Alert and oriented. Well appearing and in no acute distress. Eyes: Conjunctivae are normal. EOMI. Head: Atraumatic. Neck: No stridor. Tenderness of midline and right paraspinal tenderness over the lower cervical spine.  Respiratory: Normal respiratory effort.   Musculoskeletal: Diffuse tenderness about the right shoulder. No weakness appreciated on exam against resistance. Grip strength is equal. Pain increases on  abduction at 90 degrees. Neurologic:  Normal speech and language. No gross focal neurologic deficits are appreciated. Speech is normal. No gait instability. Skin:  Skin is warm, dry and intact. Atraumatic. Psychiatric: Mood and affect are normal. Speech and behavior are normal.  ____________________________________________   LABS (all labs ordered are listed, but only abnormal results are displayed)  Labs Reviewed - No data to display ____________________________________________  RADIOLOGY  Multilevel degenerative disc disease without evidence for displace fracture per radiology. ____________________________________________   PROCEDURES  Procedure(s) performed: None   ____________________________________________   INITIAL IMPRESSION / ASSESSMENT AND PLAN / ED COURSE     Pertinent labs & imaging results that were available during my care of the patient were reviewed by me and considered in my medical decision making (see chart for details).  51 year old male presenting for evaluation of right shoulder pain. X-ray consistent with degenerative disc disease which also correlates with exam. He was given 6 tablets of Percocet and a Prednisone dose pack. He is to follow up with orthopedics. He was advised to return to the ER for symptoms that change or worsen if unable to schedule an appointment. ____________________________________________   FINAL CLINICAL IMPRESSION(S) / ED DIAGNOSES  Final diagnoses:  Cervical radiculopathy       Chinita PesterCari B Tiasha Helvie, FNP 08/16/16 1328    Charlynne Panderavid Hsienta Yao, MD 08/16/16 2056

## 2016-08-13 NOTE — ED Notes (Addendum)
See triage note  Developed right shoulder pain about 2 weeks ago w/o injury. States pain is from neck into right shoulder    No deformity noted  Positive pulses states also noticed some weakness to hand

## 2016-08-13 NOTE — Discharge Instructions (Signed)
Follow up with orthopedics for symptoms that are not improving over the week. °Return to the ER for symptoms that change or worsen if unable to schedule an appointment. °

## 2016-08-13 NOTE — ED Triage Notes (Signed)
Patient presents to ED via POV with c/o right shoulder pain x 1 month. Patient denies injury. + pulses and sensation. A&O x4.

## 2016-09-04 ENCOUNTER — Other Ambulatory Visit: Payer: Self-pay | Admitting: Adult Health Nurse Practitioner

## 2016-09-10 ENCOUNTER — Other Ambulatory Visit: Payer: Self-pay

## 2016-09-10 MED ORDER — LISINOPRIL 10 MG PO TABS: 10.0000 mg | ORAL_TABLET | Freq: Every day | ORAL | 1 refills | Status: DC

## 2016-09-10 NOTE — Telephone Encounter (Signed)
Patient walked in with a slip from [facility] stating he was out of refills on lisinopril and to contact the MD.  Requesting refill on medication.  Pharmacy of choice is [facility].

## 2016-09-24 ENCOUNTER — Ambulatory Visit: Payer: Self-pay | Admitting: Ophthalmology

## 2016-09-24 ENCOUNTER — Ambulatory Visit: Payer: Self-pay | Admitting: Urology

## 2016-09-24 VITALS — BP 113/79 | HR 80 | Temp 97.7°F | Wt 251.2 lb

## 2016-09-24 DIAGNOSIS — M5030 Other cervical disc degeneration, unspecified cervical region: Secondary | ICD-10-CM

## 2016-09-24 MED ORDER — GABAPENTIN 300 MG PO CAPS: 300.0000 mg | ORAL_CAPSULE | Freq: Three times a day (TID) | ORAL | 0 refills | Status: DC

## 2016-09-24 NOTE — Progress Notes (Signed)
Patient: Wayne Brown Male    DOB: November 06, 1965   51 y.o.   MRN: [id number] Visit Date: 09/24/2016  Today's Provider: Jacelyn Pieah Doles-Johnson, NP   Chief Complaint  Patient presents with  . Shoulder Pain    numbness/tingling from right shoulder down arm   Subjective:    HPI 51 yo WM who is having right sided neck pain with tingling sensation down the right arm.  The tingling is the worst in the right index finger.  [facility] and was oxycodone/APAP and prednisone taper.  He has not found any improvement in his shoulder pain.  He is continuing his daily acitvities in pain, but he has not noted a limit in ROM.  He has not had any shocking sensations with the pain.     CLINICAL DATA:  Patient with right shoulder pain after injury. Initial encounter.  EXAM: CERVICAL SPINE - 2-3 VIEW  COMPARISON:  None.  FINDINGS: Visualization through C7 on lateral view. Multilevel degenerative disc disease most pronounced C5-6, C6-7 C7-T1. Lateral masses articulate appropriately with the dens. Lung apices are clear. Prevertebral soft tissues are unremarkable.  IMPRESSION: Multilevel degenerative disc disease. No evidence for displaced fracture.   Allergies  Allergen Reactions  . Erythromycin Other (See Comments)    Depression per pt  . Toradol [Ketorolac Tromethamine] Nausea And Vomiting  . Tramadol Nausea And Vomiting  . Vicodin [Hydrocodone-Acetaminophen] Nausea And Vomiting  . Lithium Rash    "lithium rash" per pt  . Penicillins Rash    Per pt rxn was an infant, familial   Previous Medications   CLONAZEPAM (KLONOPIN) 0.5 MG TABLET    Take 0.5 mg by mouth 2 (two) times daily.   FUROSEMIDE (LASIX) 20 MG TABLET    Take 1 tablet (20 mg total) by mouth as needed (per pt).   LISINOPRIL (PRINIVIL,ZESTRIL) 10 MG TABLET    Take 1 tablet (10 mg total) by mouth daily.   OXYCODONE-ACETAMINOPHEN (ROXICET) 5-325 MG TABLET    Take 1 tablet by mouth every 6 (six)  hours as needed.   PREDNISONE (STERAPRED UNI-PAK 21 TAB) 10 MG (21) TBPK TABLET    Take 6 tablets on day 1 Take 5 tablets on day 2 Take 4 tablets on day 3 Take 3 tablets on day 4 Take 2 tablets on day 5 Take 1 tablet on day 6   RANITIDINE (ZANTAC) 150 MG TABLET    TAKE TWO TABLETS BY MOUTH 2 TIMES A DAY    Review of Systems  Constitutional: Negative.   HENT: Negative.   Eyes: Negative.   Respiratory: Negative.   Cardiovascular: Negative.   Gastrointestinal: Negative.   Endocrine: Negative.   Genitourinary: Negative.   Musculoskeletal: Positive for neck pain. Negative for arthralgias, back pain, gait problem, joint swelling, myalgias and neck stiffness.  Skin: Negative.   Neurological: Positive for numbness. Negative for dizziness, tremors, seizures, syncope, facial asymmetry, speech difficulty, weakness, light-headedness and headaches.  Hematological: Negative.   Psychiatric/Behavioral: Negative.     Social History  Substance Use Topics  . Smoking status: Never Smoker  . Smokeless tobacco: Never Used  . Alcohol use 0.0 oz/week     Comment: 1 or 2 beers per 6 mo    Objective:   BP 113/79   Pulse 80   Temp 97.7 F (36.5 C)   Wt 251 lb 3.2 oz (113.9 kg)   BMI 29.79 kg/m   Physical Exam Constitutional: Well nourished. Alert and oriented, No  acute distress. HEENT: [place] AT, moist mucus membranes. Trachea midline, no masses. Cardiovascular: No clubbing, cyanosis, or edema. Respiratory: Normal respiratory effort, no increased work of breathing. Skin: No rashes, bruises or suspicious lesions. Lymph: No cervical or inguinal adenopathy. Neurologic: Grossly intact, no focal deficits, moving all 4 extremities. Psychiatric: Normal mood and affect.      Assessment & Plan:     1.  Cervical neck DJD  - start gabapentin 300 mg tid  - schedule appointment with Dr. Hilario Quarry, NP   [facility]

## 2016-10-07 ENCOUNTER — Ambulatory Visit: Payer: Self-pay | Admitting: Specialist

## 2016-10-13 NOTE — Progress Notes (Unsigned)
A user error has taken place.

## 2016-10-14 ENCOUNTER — Ambulatory Visit: Payer: Self-pay | Admitting: Specialist

## 2016-10-14 DIAGNOSIS — M898X1 Other specified disorders of bone, shoulder: Principal | ICD-10-CM

## 2016-10-14 DIAGNOSIS — G8929 Other chronic pain: Secondary | ICD-10-CM

## 2016-10-14 MED ORDER — MELOXICAM 15 MG PO TABS: 15.0000 mg | ORAL_TABLET | Freq: Every day | ORAL | 0 refills | Status: DC

## 2016-10-14 NOTE — Progress Notes (Signed)
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 4-6 mo ago, developed R scapular pain. Initially radiated RUE all 5 fingers w/ N/T. That has disappeared. No specific injury. He is able to sleep on his R side. He is able to scratch the middle of his back and put his belt through the loops. Review of C spine Xray on 08/13/16. Early DJD C6/7.   Px: C/S full ROM; full ROM bilateral shoulders, elbows, wrist, fingers Spurling's test to L caused R scapular pain negative to R.  DTRs: BJ, TJ, and Brachial Radialus 1+ = Sensory/MMT normal. No scapular winging.  Provacative test above the R shoulder neg other than Yergason's test caused posterior scapular pain on the R.  Impression probable C/S degeneration early.   Rx Mobic 15mg  1qd. 30 tablets. No refills.   F/u in 3 weeks

## 2016-10-15 ENCOUNTER — Ambulatory Visit: Payer: Self-pay | Admitting: Ophthalmology

## 2016-11-04 ENCOUNTER — Ambulatory Visit: Payer: Self-pay | Admitting: Specialist

## 2016-11-04 DIAGNOSIS — M542 Cervicalgia: Secondary | ICD-10-CM

## 2016-11-04 NOTE — Progress Notes (Signed)
   Subjective:    Patient ID: Wayne Brown, male    DOB: Aug 26, 1965, 51 y.o.   MRN: [id number]  HPI Pt returns somewhat improved. He feels the improvement is more secondary to the passage of time rather than the medication. The pain is now tolerable but present. No redicular pain.  He has had no therapy for his neck. A short term course of therapy would be reasonable.    Review of Systems No change    Objective:   Physical Exam   Deffered      Assessment & Plan:  Plan: Short term course PT to establish HEP. Return prn. PT rx. PT: 2x2 for stretching and to establish a HEP

## 2016-12-05 ENCOUNTER — Emergency Department
Admission: EM | Admit: 2016-12-05 | Discharge: 2016-12-06 | Disposition: A | Discharge status: Home / Self Care | Payer: Self-pay | Attending: Emergency Medicine | Admitting: Emergency Medicine

## 2016-12-05 ENCOUNTER — Encounter: Payer: Self-pay | Admitting: Emergency Medicine

## 2016-12-05 DIAGNOSIS — I10 Essential (primary) hypertension: Secondary | ICD-10-CM | POA: Insufficient documentation

## 2016-12-05 DIAGNOSIS — Z79899 Other long term (current) drug therapy: Secondary | ICD-10-CM | POA: Insufficient documentation

## 2016-12-05 DIAGNOSIS — M79672 Pain in left foot: Secondary | ICD-10-CM | POA: Insufficient documentation

## 2016-12-05 HISTORY — DX: Gout, unspecified: M10.9

## 2016-12-05 HISTORY — DX: Essential (primary) hypertension: I10

## 2016-12-05 MED ORDER — PREDNISONE 10 MG PO TABS: 10.0000 mg | ORAL_TABLET | Freq: Every day | ORAL | 0 refills | Status: DC

## 2016-12-05 MED ORDER — DEXAMETHASONE SODIUM PHOSPHATE 10 MG/ML IJ SOLN
10.0000 mg | Freq: Once | INTRAMUSCULAR | Status: AC
  Administered 2016-12-05: 10 mg via INTRAMUSCULAR
  Filled 2016-12-05: qty 1

## 2016-12-05 NOTE — ED Triage Notes (Signed)
Patient reports pain and swelling to left foot.  Reports tender to touch.  Reports history of plantar fascitis.

## 2016-12-05 NOTE — ED Provider Notes (Signed)
[facility]-EMERGENCY DEPARTMENT Provider Note   CSN: [id number] Arrival date & time: 12/05/16  2242     History   Chief Complaint Chief Complaint  Patient presents with  . Foot Pain    HPI Seymour BarsCharles D Konen is a 51 y.o. male presents to the emergency for evaluation of left foot pain. Patient has had left foot pain for several months, was initially diagnosed with plantar fasciitis 3 months ago. He does not believe that plantar fasciitis has improved. He has a history of chronic swelling in bilateral feet and ankles, does not wear any compression stockings. His swelling is improved in the morning and is most severe at nighttime. He denies any calf pain, swelling, redness, chest pain, shortness of breath or coughing. He is here today for numbness and burning pain along the plantar aspect of his left foot that has been present for 2 weeks with no trauma or injury. He's been ambulating with a limp due to the pain at the bottom of his left foot. No history of neuropathy or diabetes.  HPI  Past Medical History:  Diagnosis Date  . Bipolar disorder (HCC)   . GERD (gastroesophageal reflux disease)   . Gout   . Hypertension   . Post traumatic stress disorder     Patient Active Problem List   Diagnosis Date Noted  . Hypertension 07/23/2016  . Restless leg syndrome 05/28/2016  . Foot pain, left 05/28/2016  . Ear pain, right 05/28/2016  . Breast pain, left 12/12/2015  . Shoulder pain, right 12/12/2015  . Alkaline phosphatase elevation 11/14/2015  . Atypical chest pain 11/14/2015  . PTSD (post-traumatic stress disorder) 11/14/2015  . Dysphagia 11/14/2015  . Abnormal weight gain 11/14/2015  . Acute gout 11/14/2015  . GERD (gastroesophageal reflux disease) 09/05/2015  . Pain and swelling of wrist 09/05/2015    History reviewed. No pertinent surgical history.     Home Medications    Prior to Admission medications   Medication Sig Start Date End Date Taking? Authorizing Provider    clonazePAM (KLONOPIN) 0.5 MG tablet Take 0.5 mg by mouth 2 (two) times daily.   Yes [provider]  furosemide (LASIX) 20 MG tablet Take 1 tablet (20 mg total) by mouth as needed (per pt). 11/19/15  Yes McGowan, Carollee HerterShannon A, PA-C  lisinopril (PRINIVIL,ZESTRIL) 10 MG tablet Take 1 tablet (10 mg total) by mouth daily. 09/10/16  Yes Doles-Johnson, Teah, NP  ranitidine (ZANTAC) 150 MG tablet TAKE TWO TABLETS BY MOUTH 2 TIMES A DAY 07/10/16  Yes McGowan, Shannon A, PA-C  predniSONE (DELTASONE) 10 MG tablet Take 1 tablet (10 mg total) by mouth daily. 6,5,4,3,2,1 six day taper 12/05/16   Evon SlackGaines, Kourtnee Lahey C, PA-C    Family History Family History  Problem Relation Age of Onset  . Cancer Father   . Cancer Mother   . Breast cancer Neg Hx     Social History Social History  Substance Use Topics  . Smoking status: Never Smoker  . Smokeless tobacco: Never Used  . Alcohol use 0.0 oz/week     Comment: 1 or 2 beers per 6 mo      Allergies   Erythromycin; Toradol [ketorolac tromethamine]; Tramadol; Vicodin [hydrocodone-acetaminophen]; Lithium; and Penicillins   Review of Systems Review of Systems  Constitutional: Negative for fever.  Cardiovascular: Positive for leg swelling (anklesbilaterally, chronic).  Musculoskeletal: Positive for gait problem, joint swelling and myalgias. Negative for back pain and neck pain.  Neurological: Negative for numbness.     Physical Exam  Updated Vital Signs BP 115/79 (BP Location: Left Arm)   Pulse 86   Temp 98 F (36.7 C) (Oral)   Resp 18   Ht 6\' 5"  (1.956 m)   Wt 251 lb (113.9 kg)   SpO2 97%   BMI 29.76 kg/m   Physical Exam  Constitutional: He appears well-developed and well-nourished.  HENT:  Head: Normocephalic and atraumatic.  Eyes: Conjunctivae are normal.  Neck: Neck supple.  Cardiovascular: Normal rate.   Pulmonary/Chest: Effort normal. No respiratory distress.  Musculoskeletal: He exhibits no edema.  Examination of the left lower  extremity shows patient has mild edema around the ankle and foot. There is no warmth or redness. There is no sensation loss. He has 2+ dorsalis pedis pulse and 2+ capillary refill. Patient is minimally tender along the lateral fascia has no tenderness along the calcaneus. He is nontender throughout the tarsal bones. Patient has mild tenderness along the arch of the foot with no painful resisted plantarflexion and dorsiflexion. No skin breakdown noted.  Neurological: He is alert.  Skin: Skin is warm and dry.  Psychiatric: He has a normal mood and affect.  Nursing note and vitals reviewed.    ED Treatments / Results  Labs (all labs ordered are listed, but only abnormal results are displayed) Labs Reviewed - No data to display  EKG  EKG Interpretation None       Radiology No results found.  Procedures Procedures (including critical care time) SPLINT APPLICATION Date/Time: 11:48 PM Authorized by: Patience Musca Consent: Verbal consent obtained. Risks and benefits: risks, benefits and alternatives were discussed Consent given by: patient Splint applied by: ED tech Location details: Left foot  Splint type: Postop shoe, crutches  Supplies used: Postop shoe, crutches  Post-procedure: The splinted body part was neurovascularly unchanged following the procedure. Patient tolerance: Patient tolerated the procedure well with no immediate complications.     Medications Ordered in ED Medications  dexamethasone (DECADRON) injection 10 mg (not administered)     Initial Impression / Assessment and Plan / ED Course  I have reviewed the triage vital signs and the nursing notes.  Pertinent labs & imaging results that were available during my care of the patient were reviewed by me and considered in my medical decision making (see chart for details).     51 year old male with pain along the arch of his left foot with burning sensation. No neurological deficit. He has  chronic bilateral ankle and feet swelling, recommended compression stockings. He is placed on prednisone to help with pain along the plantar aspect of his foot. He is given crutches to help with ambulation. He will follow-up with doctor in 1 week if no improvement. He is educated on signs and symptoms return to the ED for.  Final Clinical Impressions(s) / ED Diagnoses   Final diagnoses:  Foot pain, left    New Prescriptions New Prescriptions   PREDNISONE (DELTASONE) 10 MG TABLET    Take 1 tablet (10 mg total) by mouth daily. 6,5,4,3,2,1 six day taper     Ronnette Juniper 12/05/16 2349    Emily Filbert, MD 12/07/16 [phone]

## 2016-12-05 NOTE — Discharge Instructions (Signed)
Please use crutches to help with ambulation. Wear compression stockings on bilateral lower extremities to help with edema in both feet and ankles. Take prednisone as prescribed. Follow-up with foot doctor in one week if no improvement.

## 2016-12-06 NOTE — ED Notes (Signed)
Pt reports burning and swelling to left foot/ankle for several weeks; swelling improved when he wakes in the morning and increases throughout the day; pain to bottom of foot and across top of foot; history of plantar fasciitis and gout;

## 2016-12-06 NOTE — ED Notes (Signed)
Shoe applied by [place]. Gaines, PA and pt was already able to demonstrate proper use of crutches from prior injuries

## 2016-12-07 ENCOUNTER — Other Ambulatory Visit: Payer: Self-pay | Admitting: Urology

## 2016-12-15 ENCOUNTER — Telehealth: Payer: Self-pay | Admitting: Pharmacy Technician

## 2016-12-15 NOTE — Telephone Encounter (Signed)
Patient eligible to receive medication assistance at [facility] until 09/17/17 as long as eligibility requirements continue to be met.  Wayne Brown J. Wayne Brown Care Manager [facility] 

## 2016-12-21 ENCOUNTER — Encounter (INDEPENDENT_AMBULATORY_CARE_PROVIDER_SITE_OTHER): Payer: Self-pay

## 2016-12-21 ENCOUNTER — Ambulatory Visit: Payer: Self-pay | Admitting: Pharmacist

## 2016-12-21 VITALS — BP 118/70 | Ht 76.0 in | Wt 255.0 lb

## 2016-12-21 DIAGNOSIS — Z79899 Other long term (current) drug therapy: Secondary | ICD-10-CM

## 2016-12-21 NOTE — Progress Notes (Signed)
[facility] Visit Note  Patient: Wayne Brown MRN: [id number] Date of Birth: [birth year] PCP: Zachery Dauer, FNP   Wayne Brown 51 y.o. male presents for an initial MTM  visit today.  BP 118/70 (BP Location: Right Arm, Patient Position: Sitting)   Wt 255 lb (115.7 kg)   BMI 30.24 kg/m   Patient Information   Past Medical History:  Diagnosis Date  . Bipolar disorder (HCC)   . GERD (gastroesophageal reflux disease)   . Gout   . Hypertension   . Post traumatic stress disorder       Past Surgical History:  Procedure Laterality Date  . TONSILLECTOMY  51 yrs old     Family History  Problem Relation Age of Onset  . Cancer Father 61  . Heart disease Father   . Diabetes Father   . Cancer Mother 49       Colon Cancer  . Breast cancer Neg Hx     New Diagnoses (since last visit):   Family Support: Good  Lifestyle Diet: Breakfast: None Lunch: None Dinner: Meat, carb (mostly pasta), does not usually eat veggies  Drinks: Coke, Pepsi, Tea       Current Exercise Habits: The patient does not participate in regular exercise at present  Exercise limited by: None identified    History  Alcohol Use  . 0.0 oz/week    Comment: 1 or 2 beers per 6 mo       History  Smoking Status  . Never Smoker  Smokeless Tobacco  . Never Used      Health Maintenance  Topic Date Due  . HIV Screening  12/03/1982  . TETANUS/TDAP  12/03/1986  . INFLUENZA VACCINE  02/17/2017   Prior to Admission medications   Medication Sig Start Date End Date Taking? Authorizing Provider  buPROPion (WELLBUTRIN XL) 150 MG 24 hr tablet Take 150 mg by mouth daily.   Yes [provider]  clonazePAM (KLONOPIN) 0.5 MG tablet Take 0.5 mg by mouth 2 (two) times daily.   Yes [provider]  furosemide (LASIX) 20 MG tablet Take 1 tablet (20 mg total) by mouth as needed (per pt). 12/14/16  Yes McGowan, Carollee Herter A, PA-C  hydrOXYzine (ATARAX/VISTARIL) 10 MG tablet  Take 10 mg by mouth 2 (two) times daily.   Yes [provider]  lisinopril (PRINIVIL,ZESTRIL) 10 MG tablet Take 1 tablet (10 mg total) by mouth daily. 09/10/16  Yes Doles-Johnson, Teah, NP  ranitidine (ZANTAC) 150 MG tablet TAKE TWO TABLETS BY MOUTH 2 TIMES A DAY 07/10/16  Yes McGowan, Shannon A, PA-C  predniSONE (DELTASONE) 10 MG tablet Take 1 tablet (10 mg total) by mouth daily. 6,5,4,3,2,1 six day taper Patient not taking: Reported on 12/21/2016 12/05/16   Evon Slack, PA-C    Assessment and Plan: Wayne Brown is a 51 yoM who presents for an initial MTM visit at [facility]. His PMH includes HTN, GERD, PTSD, bipolar disorder, pre-diabetes, and chronic pain. Recently seen in the ED on 12/05/16 for foot pain and on 08/13/16 for shoulder pain.   Bipolar disorder/PTSD: Patient continues to have symptoms daily. He follows with [facility], but can only be seen for 8 visits in 6 months due to insurance. Recently he was started on bupropion and does not see a change at this time. In addition to bupropion he also takes clonazepam and hydroxyzine. Past treatments include escitalopram, sertraline, lithium, Latuda, Seroquel.   HTN: controlled at this time. Complains of some light-headedness and  dizziness going from sitting to standing and/or bending down. Currently not checking BP daily. Informed him of the importance of checking with his current symptoms. Counseled on low salt diet. He currently is not using salt for his food. On furosemide due to swelling in legs and lisinopril.   Pre-diabetes: last A1C in 06/2016 was 5.7% which is pre-diabetes. Informed patient of this and counseled on diet modifications and weight loss at this time.   Chronic pain: pain score between a 3-4 today and is usually comfortable at this. Going to see a physical therapist to help strengthen the muscles in his neck. Takes ibuprofen and APAP on rare occasion for the pain.   Return in 1 year for follow-up appointment or sooner if  questions or concerns.  Wayne Brown 4:15 PM 12/21/2016

## 2017-01-14 ENCOUNTER — Telehealth: Payer: Self-pay

## 2017-01-14 NOTE — Telephone Encounter (Signed)
Rescheduled appt due to limited providers. Patient is needing to update his application. Told him to bring it in to his appt.

## 2017-01-21 ENCOUNTER — Ambulatory Visit: Payer: Self-pay

## 2017-01-28 NOTE — Telephone Encounter (Signed)
Called pt as he needs to bring in utility bill/food stamp letter (or other proof of residency) to 02/04/2017 appt or no further appts will be made.  MJS

## 2017-02-04 ENCOUNTER — Ambulatory Visit: Payer: Self-pay | Admitting: Urology

## 2017-02-04 VITALS — BP 114/77 | HR 77 | Wt 256.0 lb

## 2017-02-04 DIAGNOSIS — K219 Gastro-esophageal reflux disease without esophagitis: Secondary | ICD-10-CM

## 2017-02-04 DIAGNOSIS — R5383 Other fatigue: Secondary | ICD-10-CM

## 2017-02-04 MED ORDER — RANITIDINE HCL 150 MG PO TABS: ORAL_TABLET | ORAL | 0 refills | Status: DC

## 2017-02-04 MED ORDER — LISINOPRIL 10 MG PO TABS: 10.0000 mg | ORAL_TABLET | Freq: Every day | ORAL | 1 refills | Status: DC

## 2017-02-04 MED ORDER — FUROSEMIDE 20 MG PO TABS: 20.0000 mg | ORAL_TABLET | ORAL | 0 refills | Status: DC | PRN

## 2017-02-04 NOTE — Progress Notes (Signed)
  Patient: Seymour BarsCharles D Utz Male    DOB: 1966-02-13   51 y.o.   MRN: [id number] Visit Date: 02/04/2017  Today's Provider: [facility]   Chief Complaint  Patient presents with  . Neck Pain   Subjective:    HPI Patient is having fatigue x 2 months.  He has reduced the amount of caffeine in his diet.  He admits that he has gained weight.  His therapist told him he needs to have his thyroid and testosterone checked.     Allergies  Allergen Reactions  . Erythromycin Other (See Comments)    Depression per pt  . Toradol [Ketorolac Tromethamine] Nausea And Vomiting  . Tramadol Nausea And Vomiting  . Vicodin [Hydrocodone-Acetaminophen] Nausea And Vomiting  . Lithium Rash    "lithium rash" per pt  . Penicillins Rash    Per pt rxn was an infant, familial   Previous Medications   BUPROPION (WELLBUTRIN XL) 150 MG 24 HR TABLET    Take 150 mg by mouth daily.   CLONAZEPAM (KLONOPIN) 0.5 MG TABLET    Take 0.5 mg by mouth 2 (two) times daily.   HYDROXYZINE (ATARAX/VISTARIL) 10 MG TABLET    Take 10 mg by mouth 2 (two) times daily.   PREDNISONE (DELTASONE) 10 MG TABLET    Take 1 tablet (10 mg total) by mouth daily. 6,5,4,3,2,1 six day taper    Review of Systems  Social History  Substance Use Topics  . Smoking status: Never Smoker  . Smokeless tobacco: Never Used  . Alcohol use 0.0 oz/week     Comment: 1 or 2 beers per 6 mo    Objective:   BP 114/77   Pulse 77   Wt 256 lb (116.1 kg)   BMI 31.16 kg/m   Physical Exam Constitutional: Well nourished. Alert and oriented, No acute distress. HEENT: [place] AT, moist mucus membranes. Trachea midline, no masses. Cardiovascular: No clubbing, cyanosis, or edema. Respiratory: Normal respiratory effort, no increased work of breathing. Skin: No rashes, bruises or suspicious lesions. Lymph: No cervical or inguinal adenopathy. Neurologic: Grossly intact, no focal deficits, moving all 4 extremities. Psychiatric: Normal mood and  affect.      Assessment & Plan:    1. Fatigue  - will check blood work  - RTC pending labs       [facility]   [facility]

## 2017-02-09 ENCOUNTER — Other Ambulatory Visit: Payer: Self-pay

## 2017-02-09 DIAGNOSIS — R5383 Other fatigue: Secondary | ICD-10-CM

## 2017-02-10 LAB — TESTOSTERONE: TESTOSTERONE: 250 ng/dL — AB (ref 264–916)

## 2017-02-10 LAB — CBC WITH DIFFERENTIAL/PLATELET
BASOS: 1 %
Basophils Absolute: 0.1 10*3/uL (ref 0.0–0.2)
EOS (ABSOLUTE): 0.4 10*3/uL (ref 0.0–0.4)
Eos: 5 %
HEMATOCRIT: 42.5 % (ref 37.5–51.0)
HEMOGLOBIN: 14.4 g/dL (ref 13.0–17.7)
IMMATURE GRANS (ABS): 0 10*3/uL (ref 0.0–0.1)
Immature Granulocytes: 0 %
LYMPHS ABS: 2.5 10*3/uL (ref 0.7–3.1)
LYMPHS: 30 %
MCH: 30.1 pg (ref 26.6–33.0)
MCHC: 33.9 g/dL (ref 31.5–35.7)
MCV: 89 fL (ref 79–97)
MONOCYTES: 9 %
Monocytes Absolute: 0.8 10*3/uL (ref 0.1–0.9)
NEUTROS ABS: 4.7 10*3/uL (ref 1.4–7.0)
Neutrophils: 55 %
Platelets: 253 10*3/uL (ref 150–379)
RBC: 4.78 x10E6/uL (ref 4.14–5.80)
RDW: 14.3 % (ref 12.3–15.4)
WBC: 8.4 10*3/uL (ref 3.4–10.8)

## 2017-02-10 LAB — COMPREHENSIVE METABOLIC PANEL
A/G RATIO: 1.6 (ref 1.2–2.2)
ALBUMIN: 4.6 g/dL (ref 3.5–5.5)
ALT: 31 IU/L (ref 0–44)
AST: 20 IU/L (ref 0–40)
Alkaline Phosphatase: 117 IU/L (ref 39–117)
BUN / CREAT RATIO: 8 — AB (ref 9–20)
BUN: 9 mg/dL (ref 6–24)
Bilirubin Total: 0.6 mg/dL (ref 0.0–1.2)
CALCIUM: 9.5 mg/dL (ref 8.7–10.2)
CO2: 24 mmol/L (ref 20–29)
Chloride: 104 mmol/L (ref 96–106)
Creatinine, Ser: 1.09 mg/dL (ref 0.76–1.27)
GFR, EST AFRICAN AMERICAN: 92 mL/min/{1.73_m2} (ref >59)
GFR, EST NON AFRICAN AMERICAN: 79 mL/min/{1.73_m2} (ref >59)
GLUCOSE: 85 mg/dL (ref 65–99)
Globulin, Total: 2.8 g/dL (ref 1.5–4.5)
Potassium: 4.2 mmol/L (ref 3.5–5.2)
Sodium: 143 mmol/L (ref 134–144)
TOTAL PROTEIN: 7.4 g/dL (ref 6.0–8.5)

## 2017-02-10 LAB — LIPID PANEL
Chol/HDL Ratio: 4.3 ratio (ref 0.0–5.0)
Cholesterol, Total: 154 mg/dL (ref 100–199)
HDL: 36 mg/dL — AB (ref >39)
LDL Calculated: 77 mg/dL (ref 0–99)
Triglycerides: 204 mg/dL — ABNORMAL HIGH (ref 0–149)
VLDL CHOLESTEROL CAL: 41 mg/dL — AB (ref 5–40)

## 2017-02-10 LAB — HEMOGLOBIN A1C
Est. average glucose Bld gHb Est-mCnc: 126 mg/dL
Hgb A1c MFr Bld: 6 % — ABNORMAL HIGH (ref 4.8–5.6)

## 2017-03-25 ENCOUNTER — Telehealth: Payer: Self-pay

## 2017-03-25 NOTE — Telephone Encounter (Signed)
Called pt with note from doctor. Left msg.

## 2017-03-25 NOTE — Telephone Encounter (Signed)
Pt called back and verbalized understanding. PT will come by and get a copy of test results so he can take to another doctor so they can get him a rx for his testosterone being low. [facility].

## 2017-03-25 NOTE — Telephone Encounter (Signed)
-----   Message from Wayne Brown sent at 03/24/2017  2:21 PM EDT -----   ----- Message ----- From: Harle BattiestMcGowan, Shannon A, PA-C Sent: 03/24/2017   7:57 AM To: Wayne Brown  Unfortunately, there is not any OTC medications for low testosterone.   ----- Message ----- From: Smiley Housemanarter, Lorrie D Sent: 02/18/2017   2:05 PM To: Harle BattiestShannon A McGowan, PA-C  Carollee HerterShannon patient wants to know if there is any OTC medicine he can take for the low testosterone?

## 2017-04-15 ENCOUNTER — Ambulatory Visit: Payer: Self-pay | Admitting: Ophthalmology

## 2017-04-29 ENCOUNTER — Ambulatory Visit: Payer: Self-pay | Admitting: Ophthalmology

## 2017-05-06 ENCOUNTER — Ambulatory Visit: Payer: Self-pay | Admitting: Ophthalmology

## 2017-05-17 ENCOUNTER — Other Ambulatory Visit: Payer: Self-pay | Admitting: Urology

## 2017-05-17 DIAGNOSIS — K219 Gastro-esophageal reflux disease without esophagitis: Secondary | ICD-10-CM

## 2017-09-16 ENCOUNTER — Emergency Department: Payer: Self-pay

## 2017-09-16 ENCOUNTER — Emergency Department
Admission: EM | Admit: 2017-09-16 | Discharge: 2017-09-16 | Disposition: A | Discharge status: Home / Self Care | Payer: Self-pay | Attending: Emergency Medicine | Admitting: Emergency Medicine

## 2017-09-16 ENCOUNTER — Other Ambulatory Visit: Payer: Self-pay

## 2017-09-16 ENCOUNTER — Encounter: Payer: Self-pay | Admitting: *Deleted

## 2017-09-16 DIAGNOSIS — Z79899 Other long term (current) drug therapy: Secondary | ICD-10-CM | POA: Insufficient documentation

## 2017-09-16 DIAGNOSIS — Y939 Activity, unspecified: Secondary | ICD-10-CM | POA: Insufficient documentation

## 2017-09-16 DIAGNOSIS — S60011A Contusion of right thumb without damage to nail, initial encounter: Secondary | ICD-10-CM | POA: Insufficient documentation

## 2017-09-16 DIAGNOSIS — I10 Essential (primary) hypertension: Secondary | ICD-10-CM | POA: Insufficient documentation

## 2017-09-16 DIAGNOSIS — Y999 Unspecified external cause status: Secondary | ICD-10-CM | POA: Insufficient documentation

## 2017-09-16 DIAGNOSIS — Y92810 Car as the place of occurrence of the external cause: Secondary | ICD-10-CM | POA: Insufficient documentation

## 2017-09-16 DIAGNOSIS — W231XXA Caught, crushed, jammed, or pinched between stationary objects, initial encounter: Secondary | ICD-10-CM | POA: Insufficient documentation

## 2017-09-16 MED ORDER — IBUPROFEN 800 MG PO TABS: 800.0000 mg | ORAL_TABLET | Freq: Three times a day (TID) | ORAL | 0 refills | Status: DC | PRN

## 2017-09-16 MED ORDER — ACETAMINOPHEN-CODEINE #3 300-30 MG PO TABS: 1.0000 | ORAL_TABLET | Freq: Four times a day (QID) | ORAL | 0 refills | Status: DC | PRN
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
## 2017-09-16 NOTE — Discharge Instructions (Signed)
[facility] if you are not better in 5-7 days.  Use medication as prescribed.  Keep the hand elevated and iced as much as possible.  Return to the emergency department if it is worsening.
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
## 2017-09-30 ENCOUNTER — Other Ambulatory Visit: Payer: Self-pay

## 2017-09-30 MED ORDER — LISINOPRIL 10 MG PO TABS: 10.0000 mg | ORAL_TABLET | Freq: Every day | ORAL | 1 refills | Status: DC

## 2017-09-30 NOTE — Telephone Encounter (Signed)
Received fax from [facility] requesting refill on lisinopril  tablets.

## 2017-10-07 ENCOUNTER — Telehealth: Payer: Self-pay | Admitting: Pharmacy Technician

## 2017-10-07 NOTE — Telephone Encounter (Signed)
Received updated proof of income.  Patient eligible to receive medication assistance at [facility] through 2019, as long as eligibility requirements continue to be met.  [facility]

## 2017-12-23 ENCOUNTER — Encounter: Payer: Self-pay | Admitting: Pharmacist

## 2018-01-11 ENCOUNTER — Other Ambulatory Visit: Payer: Self-pay | Admitting: Internal Medicine

## 2018-01-11 DIAGNOSIS — K219 Gastro-esophageal reflux disease without esophagitis: Secondary | ICD-10-CM

## 2018-02-08 ENCOUNTER — Other Ambulatory Visit: Payer: Self-pay | Admitting: Urology

## 2018-02-08 ENCOUNTER — Encounter (INDEPENDENT_AMBULATORY_CARE_PROVIDER_SITE_OTHER): Payer: Self-pay

## 2018-02-08 ENCOUNTER — Ambulatory Visit: Payer: Self-pay | Admitting: Pharmacist

## 2018-02-08 ENCOUNTER — Other Ambulatory Visit: Payer: Self-pay

## 2018-02-08 VITALS — BP 132/68 | Ht 77.0 in | Wt 250.0 lb

## 2018-02-08 DIAGNOSIS — Z79899 Other long term (current) drug therapy: Secondary | ICD-10-CM

## 2018-02-08 DIAGNOSIS — K219 Gastro-esophageal reflux disease without esophagitis: Secondary | ICD-10-CM

## 2018-02-08 NOTE — Progress Notes (Signed)
Medication Management Clinic Visit Note  Patient: Wayne Brown MRN: [id number] Date of Birth: [birth year] PCP: Zachery Dauerdem, Donna S, FNP   Wayne Brown 52 y.o. male presents for his annual medication therapy management review with the pharmacist.  BP 132/68 (BP Location: Right Arm, Patient Position: Sitting, Cuff Size: Large)   Ht 6\' 5"  (1.956 m)   Wt 250 lb (113.4 kg)   BMI 29.65 kg/m   Patient Information   Past Medical History:  Diagnosis Date  . Bipolar disorder (HCC)   . GERD (gastroesophageal reflux disease)   . Gout   . Hypertension   . Post traumatic stress disorder       Past Surgical History:  Procedure Laterality Date  . TONSILLECTOMY  52 yrs old     Family History  Problem Relation Age of Onset  . Cancer Father [id number]  . Heart disease Father   . Diabetes Father   . Cancer Mother [id number]       Colon Cancer  . Breast cancer Neg Hx     New Diagnoses (since last visit): n/a  Family Support: Good  Lifestyle Diet: Breakfast: skips  Lunch: varies Dinner: no veggies Drinks: water with flavoring, soft drinks, no coffee    Current Exercise Habits: The patient does not participate in regular exercise at present  Exercise limited by: None identified    Social History   Substance and Sexual Activity  Alcohol Use Not Currently  . Alcohol/week: 0.0 oz   Comment: holidays 1 drink      Social History   Tobacco Use  Smoking Status Never Smoker  Smokeless Tobacco Never Used      Health Maintenance  Topic Date Due  . HIV Screening  12/03/1982  . TETANUS/TDAP  12/03/1986  . COLONOSCOPY  12/02/2017  . INFLUENZA VACCINE  02/17/2018    Outpatient Encounter Medications as of 02/08/2018  Medication Sig  . clonazePAM (KLONOPIN) 0.5 MG tablet Take 0.5 mg by mouth 2 (two) times daily.  . hydrOXYzine (ATARAX/VISTARIL) 10 MG tablet Take 10 mg by mouth 3 (three) times daily as needed.   Marland Kitchen. ibuprofen (ADVIL,MOTRIN) 800 MG tablet Take 1 tablet (800 mg total)  by mouth every 8 (eight) hours as needed.  Marland Kitchen. lisinopril (PRINIVIL,ZESTRIL) 10 MG tablet Take 1 tablet (10 mg total) by mouth daily.  . ranitidine (ZANTAC) 150 MG tablet Take 150 mg by mouth daily. Prescribed: 2 tab twice daily  . [DISCONTINUED] ranitidine (ZANTAC) 150 MG tablet TAKE TWO TABLETS BY MOUTH 2 TIMES A DAY  . [DISCONTINUED] acetaminophen-codeine (TYLENOL #3) 300-30 MG tablet Take 1 tablet by mouth every 6 (six) hours as needed for moderate pain.  . [DISCONTINUED] furosemide (LASIX) 20 MG tablet Take 1 tablet (20 mg total) by mouth as needed (per pt).   No facility-administered encounter medications on file as of 02/08/2018.     Assessment:  Compliance Does not use a pill box. States he rarely missed a dose of medication. Last seen at [facility] 02-04-17.  Mental Health- hydroxyzine, clonazepam Patient states he no longer has "bipolar disorder". He was recently diagnosed with "intermittent explosive disorder". He also has a hx of PTSD. Patient [facility]-3 months with intermittent group therapy.  Pre-Diabetes/labs Last A1c 02/09/17 = 6%. Recommend repeat A1c; patient may benefit from the addition of metformin. TC = 154, TG = 204, HDL = 36, LDL = 77 (02/09/17); Scr = 1.09 (02/09/17)  Hypertension- lisinopril Blood pressure  controlled per today's reading of 132/68 mmHg.  GERD-ranitidine Patient has been taking ranitidine 150 mg - 1 tablet daily. The prescription was written for 2 tablets twice daily.    PLAN: Pharmacist to contact [facility] to schedule an annual appointment with labs and eye exam. Return to clinic in 1 year   Albeiro Trompeter K. Joelene Millin, PharmD [facility] Clinic-Pharmacy Operations Coordinator [phone]

## 2018-02-08 NOTE — Patient Instructions (Signed)
[facility] will contact [facility] about scheduling an appointment, labs and an eye exam.

## 2018-03-30 ENCOUNTER — Other Ambulatory Visit: Payer: Self-pay | Admitting: Urology

## 2018-05-04 ENCOUNTER — Ambulatory Visit: Payer: Self-pay | Admitting: Internal Medicine

## 2018-05-04 ENCOUNTER — Encounter: Payer: Self-pay | Admitting: Internal Medicine

## 2018-05-04 ENCOUNTER — Ambulatory Visit: Payer: Self-pay | Admitting: Ophthalmology

## 2018-05-04 VITALS — BP 115/77 | HR 67 | Temp 97.9°F | Ht 77.0 in | Wt 248.2 lb

## 2018-05-04 DIAGNOSIS — K219 Gastro-esophageal reflux disease without esophagitis: Secondary | ICD-10-CM

## 2018-05-04 DIAGNOSIS — Z0000 Encounter for general adult medical examination without abnormal findings: Secondary | ICD-10-CM

## 2018-05-04 DIAGNOSIS — I10 Essential (primary) hypertension: Secondary | ICD-10-CM

## 2018-05-04 MED ORDER — LISINOPRIL 10 MG PO TABS: 10.0000 mg | ORAL_TABLET | Freq: Every day | ORAL | 3 refills | Status: DC

## 2018-05-04 MED ORDER — RANITIDINE HCL 150 MG PO TABS: 150.0000 mg | ORAL_TABLET | Freq: Every day | ORAL | 3 refills | Status: DC

## 2018-05-04 NOTE — Progress Notes (Signed)
   Subjective:    Patient ID: Wayne Brown, male    DOB: May 31, 1966, 52 y.o.   MRN: [id number]  HPI   Patient does not report any new health concerns.   [facility] handles mental health care.   Chief Complaint  Patient presents with  . Follow-up    Needs medication refills    Review of Systems Patient Active Problem List   Diagnosis Date Noted  . Hypertension 07/23/2016  . Restless leg syndrome 05/28/2016  . Foot pain, left 05/28/2016  . Ear pain, right 05/28/2016  . Breast pain, left 12/12/2015  . Shoulder pain, right 12/12/2015  . Alkaline phosphatase elevation 11/14/2015  . Atypical chest pain 11/14/2015  . PTSD (post-traumatic stress disorder) 11/14/2015  . Dysphagia 11/14/2015  . Abnormal weight gain 11/14/2015  . Acute gout 11/14/2015  . GERD (gastroesophageal reflux disease) 09/05/2015  . Pain and swelling of wrist 09/05/2015   Allergies as of 05/04/2018      Reactions   Erythromycin Other (See Comments)   Depression per pt   Toradol [ketorolac Tromethamine] Nausea And Vomiting   Tramadol Nausea And Vomiting   Vicodin [hydrocodone-acetaminophen] Nausea And Vomiting   Lithium Rash   "lithium rash" per pt   Penicillins Rash   Per pt rxn was an infant, familial      Medication List        Accurate as of 05/04/18 11:21 AM. Always use your most recent med list.          clonazePAM 0.5 MG tablet Commonly known as:  KLONOPIN Take 0.5 mg by mouth 2 (two) times daily.   hydrOXYzine 10 MG tablet Commonly known as:  ATARAX/VISTARIL Take 10 mg by mouth 3 (three) times daily as needed.   ibuprofen 800 MG tablet Commonly known as:  ADVIL,MOTRIN Take 1 tablet (800 mg total) by mouth every 8 (eight) hours as needed.   lisinopril 10 MG tablet Commonly known as:  PRINIVIL,ZESTRIL Take 1 tablet (10 mg total) by mouth daily.   ranitidine 150 MG tablet Commonly known as:  ZANTAC Take 150 mg by mouth daily. Prescribed: 2 tab twice daily         Objective:     Physical Exam  Constitutional: He is oriented to person, place, and time.  Cardiovascular: Normal rate, regular rhythm and normal heart sounds.  Pulmonary/Chest: Effort normal and breath sounds normal.  Neurological: He is alert and oriented to person, place, and time.   BP 115/77   Pulse 67   Temp 97.9 F (36.6 C) (Oral)   Ht '6\' 5"'$  (1.956 m)   Wt 248 lb 3.2 oz (112.6 kg)   BMI 29.43 kg/m      Assessment & Plan:   1. Essential hypertension Controlled.   Order Today:  - lisinopril (PRINIVIL,ZESTRIL) 10 MG tablet; Take 1 tablet (10 mg total) by mouth daily.  Dispense: 90 tablet; Refill: 3  2. Gastroesophageal reflux disease, esophagitis presence not specified Controlled.   Order Today:  - ranitidine (ZANTAC) 150 MG tablet; Take 1 tablet (150 mg total) by mouth daily. Prescribed: 2 tab twice daily  Dispense: 90 tablet; Refill: 3  3. Health care maintenance Check Today:  - Comprehensive metabolic panel - CBC - Urinalysis - Lipid panel - TSH - Hemoglobin A1c   Follow up in one year with labs (Met C, CBC, UA, Lipid, TSH, A1C) a week prior.

## 2018-05-05 LAB — LIPID PANEL
Chol/HDL Ratio: 4.2 ratio (ref 0.0–5.0)
Cholesterol, Total: 151 mg/dL (ref 100–199)
HDL: 36 mg/dL — ABNORMAL LOW (ref >39)
LDL Calculated: 92 mg/dL (ref 0–99)
Triglycerides: 116 mg/dL (ref 0–149)
VLDL CHOLESTEROL CAL: 23 mg/dL (ref 5–40)

## 2018-05-05 LAB — CBC
Hematocrit: 41.9 % (ref 37.5–51.0)
Hemoglobin: 14.2 g/dL (ref 13.0–17.7)
MCH: 30.5 pg (ref 26.6–33.0)
MCHC: 33.9 g/dL (ref 31.5–35.7)
MCV: 90 fL (ref 79–97)
PLATELETS: 233 10*3/uL (ref 150–450)
RBC: 4.66 x10E6/uL (ref 4.14–5.80)
RDW: 13.5 % (ref 12.3–15.4)
WBC: 8.6 10*3/uL (ref 3.4–10.8)

## 2018-05-05 LAB — URINALYSIS
Bilirubin, UA: NEGATIVE
Glucose, UA: NEGATIVE
Ketones, UA: NEGATIVE
Nitrite, UA: NEGATIVE
PROTEIN UA: NEGATIVE
RBC UA: NEGATIVE
SPEC GRAV UA: 1.024 (ref 1.005–1.030)
Urobilinogen, Ur: 0.2 mg/dL (ref 0.2–1.0)
pH, UA: 5.5 (ref 5.0–7.5)

## 2018-05-05 LAB — COMPREHENSIVE METABOLIC PANEL
A/G RATIO: 1.8 (ref 1.2–2.2)
ALT: 27 IU/L (ref 0–44)
AST: 17 IU/L (ref 0–40)
Albumin: 4.4 g/dL (ref 3.5–5.5)
Alkaline Phosphatase: 120 IU/L — ABNORMAL HIGH (ref 39–117)
BUN / CREAT RATIO: 13 (ref 9–20)
BUN: 14 mg/dL (ref 6–24)
Bilirubin Total: 0.5 mg/dL (ref 0.0–1.2)
CALCIUM: 9.3 mg/dL (ref 8.7–10.2)
CO2: 23 mmol/L (ref 20–29)
Chloride: 104 mmol/L (ref 96–106)
Creatinine, Ser: 1.11 mg/dL (ref 0.76–1.27)
GFR, EST AFRICAN AMERICAN: 89 mL/min/{1.73_m2} (ref >59)
GFR, EST NON AFRICAN AMERICAN: 77 mL/min/{1.73_m2} (ref >59)
GLUCOSE: 95 mg/dL (ref 65–99)
Globulin, Total: 2.4 g/dL (ref 1.5–4.5)
POTASSIUM: 4.1 mmol/L (ref 3.5–5.2)
SODIUM: 141 mmol/L (ref 134–144)
Total Protein: 6.8 g/dL (ref 6.0–8.5)

## 2018-05-05 LAB — TSH: TSH: 3.25 u[IU]/mL (ref 0.450–4.500)

## 2018-05-05 LAB — HEMOGLOBIN A1C
Est. average glucose Bld gHb Est-mCnc: 120 mg/dL
HEMOGLOBIN A1C: 5.8 % — AB (ref 4.8–5.6)

## 2018-05-16 ENCOUNTER — Encounter: Payer: Self-pay | Admitting: Emergency Medicine

## 2018-05-16 ENCOUNTER — Emergency Department
Admission: EM | Admit: 2018-05-16 | Discharge: 2018-05-16 | Disposition: A | Discharge status: Home / Self Care | Payer: Self-pay | Attending: Emergency Medicine | Admitting: Emergency Medicine

## 2018-05-16 ENCOUNTER — Other Ambulatory Visit: Payer: Self-pay

## 2018-05-16 DIAGNOSIS — F319 Bipolar disorder, unspecified: Secondary | ICD-10-CM | POA: Insufficient documentation

## 2018-05-16 DIAGNOSIS — M5431 Sciatica, right side: Secondary | ICD-10-CM | POA: Insufficient documentation

## 2018-05-16 DIAGNOSIS — Z79899 Other long term (current) drug therapy: Secondary | ICD-10-CM | POA: Insufficient documentation

## 2018-05-16 DIAGNOSIS — I10 Essential (primary) hypertension: Secondary | ICD-10-CM | POA: Insufficient documentation

## 2018-05-16 MED ORDER — DEXAMETHASONE SODIUM PHOSPHATE 10 MG/ML IJ SOLN
10.0000 mg | Freq: Once | INTRAMUSCULAR | Status: AC
  Administered 2018-05-16: 10 mg via INTRAMUSCULAR
  Filled 2018-05-16: qty 1

## 2018-05-16 MED ORDER — METHOCARBAMOL 500 MG PO TABS: 500.0000 mg | ORAL_TABLET | Freq: Four times a day (QID) | ORAL | 0 refills | Status: DC

## 2018-05-16 MED ORDER — OXYCODONE-ACETAMINOPHEN 5-325 MG PO TABS: 1.0000 | ORAL_TABLET | Freq: Four times a day (QID) | ORAL | 0 refills | Status: DC | PRN

## 2018-05-16 MED ORDER — MELOXICAM 15 MG PO TABS: 15.0000 mg | ORAL_TABLET | Freq: Every day | ORAL | 0 refills | Status: DC

## 2018-05-16 MED ORDER — ORPHENADRINE CITRATE 30 MG/ML IJ SOLN
60.0000 mg | Freq: Once | INTRAMUSCULAR | Status: AC
Start: 2018-05-16 — End: 2018-05-16
  Administered 2018-05-16: 60 mg via INTRAMUSCULAR
  Filled 2018-05-16: qty 2

## 2018-05-16 NOTE — ED Provider Notes (Signed)
[facility] Emergency Department Provider Note  ____________________________________________  Time seen: Approximately 8:27 PM  I have reviewed the triage vital signs and the nursing notes.   HISTORY  Chief Complaint Back Pain    HPI Wayne Brown is a 52 y.o. male who presents the emergency department complaining of lower back pain radiating into the right leg.  Patient reports that he has a history of sciatica from previous wrestling injury.  Patient reports that he has intermittent flares with same.  He states that he has been standing more than normal, and is felt an increasing in his lower back pain and radicular symptoms.  Patient denies any recent trauma to the area.  No urinary symptoms.  Patient states that over-the-counter medications have not been successful in reducing his symptoms.  Patient denies any bowel or bladder dysfunction, saddle anesthesia or paresthesias.    Past Medical History:  Diagnosis Date  . Bipolar disorder (HCC)   . GERD (gastroesophageal reflux disease)   . Gout   . Hypertension   . Post traumatic stress disorder     Patient Active Problem List   Diagnosis Date Noted  . Hypertension 07/23/2016  . Restless leg syndrome 05/28/2016  . Foot pain, left 05/28/2016  . Ear pain, right 05/28/2016  . Breast pain, left 12/12/2015  . Shoulder pain, right 12/12/2015  . Alkaline phosphatase elevation 11/14/2015  . Atypical chest pain 11/14/2015  . PTSD (post-traumatic stress disorder) 11/14/2015  . Dysphagia 11/14/2015  . Abnormal weight gain 11/14/2015  . Acute gout 11/14/2015  . GERD (gastroesophageal reflux disease) 09/05/2015  . Pain and swelling of wrist 09/05/2015    Past Surgical History:  Procedure Laterality Date  . TONSILLECTOMY  52 yrs old    Prior to Admission medications   Medication Sig Start Date End Date Taking? Authorizing Provider  clonazePAM (KLONOPIN) 0.5 MG tablet Take 0.5 mg by mouth 2 (two) times  daily.    [provider]  hydrOXYzine (ATARAX/VISTARIL) 10 MG tablet Take 10 mg by mouth 3 (three) times daily as needed.     [provider]  ibuprofen (ADVIL,MOTRIN) 800 MG tablet Take 1 tablet (800 mg total) by mouth every 8 (eight) hours as needed. 09/16/17   Fisher, Roselyn Bering, PA-C  lisinopril (PRINIVIL,ZESTRIL) 10 MG tablet Take 1 tablet (10 mg total) by mouth daily. 05/04/18   Virl Axe, MD  meloxicam (MOBIC) 15 MG tablet Take 1 tablet (15 mg total) by mouth daily. 05/16/18   Scotty Pinder, Delorise Royals, PA-C  methocarbamol (ROBAXIN) 500 MG tablet Take 1 tablet (500 mg total) by mouth 4 (four) times daily. 05/16/18   Katasha Riga, Delorise Royals, PA-C  oxyCODONE-acetaminophen (PERCOCET/ROXICET) 5-325 MG tablet Take 1 tablet by mouth every 6 (six) hours as needed for severe pain. 05/16/18   Donia Yokum, Delorise Royals, PA-C  ranitidine (ZANTAC) 150 MG tablet Take 1 tablet (150 mg total) by mouth daily. Prescribed: 2 tab twice daily 05/04/18   Virl Axe, MD    Allergies Erythromycin; Toradol [ketorolac tromethamine]; Tramadol; Vicodin [hydrocodone-acetaminophen]; Lithium; and Penicillins  Family History  Problem Relation Age of Onset  . Cancer Father 10  . Heart disease Father   . Diabetes Father   . Cancer Mother 65       Colon Cancer  . Breast cancer Neg Hx     Social History Social History   Tobacco Use  . Smoking status: Never Smoker  . Smokeless tobacco: Never Used  Substance Use Topics  .  Alcohol use: Not Currently    Alcohol/week: 0.0 standard drinks    Comment: holidays 1 drink  . Drug use: No    Types: Marijuana    Comment:  former use at age 45      Review of Systems  Constitutional: No fever/chills Eyes: No visual changes.  Cardiovascular: no chest pain. Respiratory: no cough. No SOB. Gastrointestinal: No abdominal pain.  No nausea, no vomiting.  No diarrhea.  No constipation. Genitourinary: Negative for dysuria. No hematuria Musculoskeletal:  Positive for lower back pain radiating down the right leg Skin: Negative for rash, abrasions, lacerations, ecchymosis. Neurological: Negative for headaches, focal weakness or numbness. 10-point ROS otherwise negative.  ____________________________________________   PHYSICAL EXAM:  VITAL SIGNS: ED Triage Vitals  Enc Vitals Group     BP 05/16/18 1913 119/70     Pulse Rate 05/16/18 1913 78     Resp 05/16/18 1913 20     Temp 05/16/18 1913 98 F (36.7 C)     Temp Source 05/16/18 1913 Oral     SpO2 05/16/18 1913 96 %     Weight 05/16/18 1908 248 lb 3.8 oz (112.6 kg)     Height 05/16/18 1908 6\' 5"  (1.956 m)     Head Circumference --      Peak Flow --      Pain Score --      Pain Loc --      Pain Edu? --      Excl. in GC? --      Constitutional: Alert and oriented. Well appearing and in no acute distress. Eyes: Conjunctivae are normal. PERRL. EOMI. Head: Atraumatic. Neck: No stridor.    Cardiovascular: Normal rate, regular rhythm. Normal S1 and S2.  Good peripheral circulation. Respiratory: Normal respiratory effort without tachypnea or retractions. Lungs CTAB. Good air entry to the bases with no decreased or absent breath sounds. Gastrointestinal: Bowel sounds 4 quadrants. Soft and nontender to palpation. No guarding or rigidity. No palpable masses. No distention. No CVA tenderness. Musculoskeletal: Full range of motion to all extremities. No gross deformities appreciated.  Visualization of the lower back reveals no deformity, ecchymosis, edema.  Patient is tender to palpation over the L5, S1 region, SI joint.  No palpable abnormality or step-off in this region.  Patient is diffusely tender to palpation along the sciatic nerve distribution with positive tenderness palpation over right-sided sciatic notch.  Negative straight leg raise right side.  Dorsalis pedis pulse intact distally.  Sensation intact and equal bilateral lower extremities. Neurologic:  Normal speech and language. No  gross focal neurologic deficits are appreciated.  Skin:  Skin is warm, dry and intact. No rash noted. Psychiatric: Mood and affect are normal. Speech and behavior are normal. Patient exhibits appropriate insight and judgement.   ____________________________________________   LABS (all labs ordered are listed, but only abnormal results are displayed)  Labs Reviewed - No data to display ____________________________________________  EKG   ____________________________________________  RADIOLOGY   No results found.  ____________________________________________    PROCEDURES  Procedure(s) performed:    Procedures    Medications  dexamethasone (DECADRON) injection 10 mg (10 mg Intramuscular Given 05/16/18 2043)  orphenadrine (NORFLEX) injection 60 mg (60 mg Intramuscular Given 05/16/18 2048)     ____________________________________________   INITIAL IMPRESSION / ASSESSMENT AND PLAN / ED COURSE  Pertinent labs & imaging results that were available during my care of the patient were reviewed by me and considered in my medical decision making (see chart for details).  Review of the [place] CSRS was performed in accordance of the [facility] prior to dispensing any controlled drugs.      Patient's diagnosis is consistent with sciatica.  Patient presents emergency department with nontraumatic increase of his sciatica.  Physical exam is reassuring with no acute findings.  No indication for labs or imaging at this time.  Patient will be given Decadron and Norflex injections as patient is allergic to Toradol.. Patient will be discharged home with prescriptions for meloxicam, Robaxin, limited Percocet.  Patient was queried in the [place] controlled substance database, no recent narcotic prescriptions though patient does receive regular Klonopin from the same provider.. Patient is to follow up with primary care as needed or otherwise directed. Patient is given ED precautions to return  to the ED for any worsening or new symptoms.     ____________________________________________  FINAL CLINICAL IMPRESSION(S) / ED DIAGNOSES  Final diagnoses:  Sciatica of right side      NEW MEDICATIONS STARTED DURING THIS VISIT:  ED Discharge Orders         Ordered    meloxicam (MOBIC) 15 MG tablet  Daily     05/16/18 2031    methocarbamol (ROBAXIN) 500 MG tablet  4 times daily     05/16/18 2031    oxyCODONE-acetaminophen (PERCOCET/ROXICET) 5-325 MG tablet  Every 6 hours PRN     05/16/18 2031              This chart was dictated using voice recognition software/Dragon. Despite best efforts to proofread, errors can occur which can change the meaning. Any change was purely unintentional.    Racheal Patches, PA-C 05/16/18 2052    Nita Sickle, MD 05/16/18 [phone]

## 2018-05-16 NOTE — ED Triage Notes (Signed)
Presents with lower back pain  States he has a hx of scitica

## 2018-08-03 ENCOUNTER — Ambulatory Visit: Payer: Self-pay | Admitting: Gerontology

## 2018-08-03 ENCOUNTER — Other Ambulatory Visit: Payer: Self-pay

## 2018-08-03 ENCOUNTER — Encounter: Payer: Self-pay | Admitting: Gerontology

## 2018-08-03 VITALS — BP 124/85 | HR 87 | Wt 258.3 lb

## 2018-08-03 DIAGNOSIS — K219 Gastro-esophageal reflux disease without esophagitis: Secondary | ICD-10-CM

## 2018-08-03 DIAGNOSIS — I10 Essential (primary) hypertension: Secondary | ICD-10-CM

## 2018-08-03 DIAGNOSIS — F4310 Post-traumatic stress disorder, unspecified: Secondary | ICD-10-CM

## 2018-08-03 DIAGNOSIS — H6692 Otitis media, unspecified, left ear: Secondary | ICD-10-CM

## 2018-08-03 DIAGNOSIS — H6121 Impacted cerumen, right ear: Secondary | ICD-10-CM

## 2018-08-03 DIAGNOSIS — Z0000 Encounter for general adult medical examination without abnormal findings: Secondary | ICD-10-CM

## 2018-08-03 MED ORDER — DOXYCYCLINE HYCLATE 50 MG PO CAPS
100.0000 mg | ORAL_CAPSULE | Freq: Two times a day (BID) | ORAL | 0 refills | Status: DC
Start: 2018-08-03 — End: 2019-02-14

## 2018-08-03 MED ORDER — OMEPRAZOLE 20 MG PO CPDR: 20.0000 mg | DELAYED_RELEASE_CAPSULE | Freq: Every day | ORAL | 3 refills | Status: DC

## 2018-08-03 NOTE — Progress Notes (Signed)
Patient: Wayne Brown Male    DOB: 03/14/1966   53 y.o.   MRN: [id number] Visit Date: 08/03/2018  Today's Provider: Rolm Gala, NP   Chief Complaint  Patient presents with  . Follow-up    pain in jaw for about 1 month   Subjective:    HPI Wayne Brown  53 Brown that has a history of low back pain, hypertension, acid reflux, ptsd and presents for follow up and c/o intermittent 3/10 left ear pain that radiates to his left jaw. He stated that it's not tooth ache because he had teeth extraction 13 years ago and uses upper and lower plates. He denies discharge from left ear and hearing loss.  He stated that he has skin tags below his armpit at lateral sides of thoracic cavity, he stated that the skin tags are not painful but " annoying"  Lower back pain: He satated he has a history of sciatica, and currently he denies back pain.  Acid reflux: He request new prescription for Omeprazole because zantac is being pulled off the shelf. He stated that his acid reflux is under control.  Hypertension: He reports taking 10 mg lisinopril daily, denies monitoring blood pressure at home. He denies Headache, chest pain, palpitation, cough.  PTSD: He reports going to [facility] for mental health, continues to take klonopin 0.5 mg bid and hydroxyzine 10 mg tid with relief. He reports that his mood still varies but denies suicidal or homicidal ideation.   He c/o cyst to medial aspect of metacarpal phalengial joint of right index finger, He stated that the cyst is not painful nor erythematous.    Allergies  Allergen Reactions  . Erythromycin Other (See Comments)    Depression per pt  . Toradol [Ketorolac Tromethamine] Nausea And Vomiting  . Tramadol Nausea And Vomiting  . Vicodin [Hydrocodone-Acetaminophen] Nausea And Vomiting  . Lithium Rash    "lithium rash" per pt  . Penicillins Rash    Per pt rxn was an infant, familial   Previous Medications   CLONAZEPAM (KLONOPIN) 0.5 MG TABLET    Take  0.5 mg by mouth 2 (two) times daily.   HYDROXYZINE (ATARAX/VISTARIL) 10 MG TABLET    Take 10 mg by mouth 3 (three) times daily as needed.    IBUPROFEN (ADVIL,MOTRIN) 800 MG TABLET    Take 1 tablet (800 mg total) by mouth every 8 (eight) hours as needed.   LISINOPRIL (PRINIVIL,ZESTRIL) 10 MG TABLET    Take 1 tablet (10 mg total) by mouth daily.   MELOXICAM (MOBIC) 15 MG TABLET    Take 1 tablet (15 mg total) by mouth daily.   METHOCARBAMOL (ROBAXIN) 500 MG TABLET    Take 1 tablet (500 mg total) by mouth 4 (four) times daily.   OXYCODONE-ACETAMINOPHEN (PERCOCET/ROXICET) 5-325 MG TABLET    Take 1 tablet by mouth every 6 (six) hours as needed for severe pain.   RANITIDINE (ZANTAC) 150 MG TABLET    Take 1 tablet (150 mg total) by mouth daily. Prescribed: 2 tab twice daily    Review of Systems  Constitutional: Negative.   HENT: Positive for ear pain (left pain) and sinus pressure (chronic sinus pressure). Negative for mouth sores.   Eyes: Negative.   Respiratory: Negative.   Cardiovascular: Negative.   Gastrointestinal: Negative.   Genitourinary: Negative.   Musculoskeletal: Positive for back pain (history of sciatica).  Skin: Color change: skin tags.  Neurological: Negative.   Psychiatric/Behavioral: Negative.     Social History  Tobacco Use  . Smoking status: Never Smoker  . Smokeless tobacco: Never Used  Substance Use Topics  . Alcohol use: Not Currently    Alcohol/week: 0.0 standard drinks    Comment: holidays 1 drink   Objective:   BP 124/85 (BP Location: Left Arm, Patient Position: Sitting)   Pulse 87   Wt 258 lb 4.8 oz (117.2 kg)   SpO2 98%   BMI 30.63 kg/m   Physical Exam Constitutional:      Appearance: Normal appearance.  HENT:     Head: Normocephalic and atraumatic.     Right Ear: There is impacted cerumen.     Left Ear: Tympanic membrane is erythematous.     Mouth/Throat:     Mouth: Mucous membranes are moist.  Eyes:     Extraocular Movements: Extraocular  movements intact.     Pupils: Pupils are equal, round, and reactive to light.  Neck:     Musculoskeletal: Normal range of motion.  Cardiovascular:     Rate and Rhythm: Normal rate and regular rhythm.     Pulses: Normal pulses.     Heart sounds: Normal heart sounds.  Pulmonary:     Effort: Pulmonary effort is normal.     Breath sounds: Normal breath sounds.  Abdominal:     General: Bowel sounds are normal.     Palpations: Abdomen is soft.  Musculoskeletal: Normal range of motion.  Skin:    General: Skin is warm and dry.       Neurological:     General: No focal deficit present.     Mental Status: He is alert and oriented to person, place, and time.  Psychiatric:        Mood and Affect: Mood normal.        Behavior: Behavior normal.        Thought Content: Thought content normal.        Judgment: Judgment normal.         Assessment & Plan:         1. Acute ear infection, left - He will start - doxycycline (VIBRAMYCIN) 50 MG capsule; Take 2 capsules (100 mg total) by mouth 2 (two) times daily.  Dispense: 14 capsule; Refill: 0 - Follow up in 3 weeks to re evaluate left ear otitis media.  2. Essential hypertension - Blood pressure  124/85 is under control, he will continue to take 10 mg lisinopril daily, continue on low salt diet and 30 minutes exercise.  3. Gastroesophageal reflux disease, esophagitis presence not specified He will continue on - omeprazole (PRILOSEC) 20 MG capsule; Take 1 capsule (20 mg total) by mouth daily.  Dispense: 30 capsule; Refill: 3  4. Health care maintenance - Stool card for colon cancer screening was provided - HIV and Hep C brochure was provided - He declined Influenza vaccine - Testosterone; Future  5. PTSD (post-traumatic stress disorder) - He will continue mental health follow up at [facility]  6: Skin tags: - He was advised to complete [facility] for dermatology referral - He was advised to monitor cyst to metacarpal phalangeal area  of right index finger, notify provider with changes in size, redness, tenderness.  7. Impacted cerumen of right ear - He was advised to use Carbamide peroxide for right cerumen impaction. - Follow up in 3 weeks.    Rolm Gala, NP   [facility] [place]

## 2018-08-03 NOTE — Patient Instructions (Addendum)
Carbamide Peroxide ear solution What is this medicine? CARBAMIDE PEROXIDE (CAR bah mide per OX ide) is used to soften and help remove ear wax. This medicine may be used for other purposes; ask your health care provider or pharmacist if you have questions. COMMON BRAND NAME(S): Auro Ear, Auro Earache Relief, Debrox, Ear Drops, Ear Wax Removal, Ear Wax Remover, Earwax Treatment, Murine, Thera-Ear What should I tell my health care provider before I take this medicine? They need to know if you have any of these conditions: -dizziness -ear discharge -ear pain, irritation or rash -infection -perforated eardrum (hole in eardrum) -an unusual or allergic reaction to carbamide peroxide, glycerin, hydrogen peroxide, other medicines, foods, dyes, or preservatives -pregnant or trying to get pregnant -breast-feeding How should I use this medicine? This medicine is only for use in the outer ear canal. Follow the directions carefully. Wash hands before and after use. The solution may be warmed by holding the bottle in the hand for 1 to 2 minutes. Lie with the affected ear facing upward. Place the proper number of drops into the ear canal. After the drops are instilled, remain lying with the affected ear upward for 5 minutes to help the drops stay in the ear canal. A cotton ball may be gently inserted at the ear opening for no longer than 5 to 10 minutes to ensure retention. Repeat, if necessary, for the opposite ear. Do not touch the tip of the dropper to the ear, fingertips, or other surface. Do not rinse the dropper after use. Keep container tightly closed. Talk to your pediatrician regarding the use of this medicine in children. While this drug may be used in children as young as 12 years for selected conditions, precautions do apply. Overdosage: If you think you have taken too much of this medicine contact a poison control center or emergency room at once. NOTE: This medicine is only for you. Do not share  this medicine with others. What if I miss a dose? If you miss a dose, use it as soon as you can. If it is almost time for your next dose, use only that dose. Do not use double or extra doses. What may interact with this medicine? Interactions are not expected. Do not use any other ear products without asking your doctor or health care professional. This list may not describe all possible interactions. Give your health care provider a list of all the medicines, herbs, non-prescription drugs, or dietary supplements you use. Also tell them if you smoke, drink alcohol, or use illegal drugs. Some items may interact with your medicine. What should I watch for while using this medicine? This medicine is not for long-term use. Do not use for more than 4 days without checking with your health care professional. Contact your doctor or health care professional if your condition does not start to get better within a few days or if you notice burning, redness, itching or swelling. What side effects may I notice from receiving this medicine? Side effects that you should report to your doctor or health care professional as soon as possible: -allergic reactions like skin rash, itching or hives, swelling of the face, lips, or tongue -burning, itching, and redness -worsening ear pain -rash Side effects that usually do not require medical attention (report to your doctor or health care professional if they continue or are bothersome): -abnormal sensation while putting the drops in the ear -temporary reduction in hearing (but not complete loss of hearing) This list may not   describe all possible side effects. Call your doctor for medical advice about side effects. You may report side effects to FDA at 1-800-FDA-1088. Where should I keep my medicine? Keep out of the reach of children. Store at room temperature between 15 and 30 degrees C (59 and 86 degrees F) in a tight, light-resistant container. Keep bottle away  from excessive heat and direct sunlight. Throw away any unused medicine after the expiration date. NOTE: This sheet is a summary. It may not cover all possible information. If you have questions about this medicine, talk to your doctor, pharmacist, or health care provider.  2019 Elsevier/Gold Standard (2007-10-18 14:00:02) DASH Eating Plan DASH stands for "Dietary Approaches to Stop Hypertension." The DASH eating plan is a healthy eating plan that has been shown to reduce high blood pressure (hypertension). It may also reduce your risk for type 2 diabetes, heart disease, and stroke. The DASH eating plan may also help with weight loss. What are tips for following this plan?  General guidelines  Avoid eating more than 2,300 mg (milligrams) of salt (sodium) a day. If you have hypertension, you may need to reduce your sodium intake to 1,500 mg a day.  Limit alcohol intake to no more than 1 drink a day for nonpregnant women and 2 drinks a day for men. One drink equals 12 oz of beer, 5 oz of wine, or 1 oz of hard liquor.  Work with your health care provider to maintain a healthy body weight or to lose weight. Ask what an ideal weight is for you.  Get at least 30 minutes of exercise that causes your heart to beat faster (aerobic exercise) most days of the week. Activities may include walking, swimming, or biking.  Work with your health care provider or diet and nutrition specialist (dietitian) to adjust your eating plan to your individual calorie needs. Reading food labels   Check food labels for the amount of sodium per serving. Choose foods with less than 5 percent of the Daily Value of sodium. Generally, foods with less than 300 mg of sodium per serving fit into this eating plan.  To find whole grains, look for the word "whole" as the first word in the ingredient list. Shopping  Buy products labeled as "low-sodium" or "no salt added."  Buy fresh foods. Avoid canned foods and premade or  frozen meals. Cooking  Avoid adding salt when cooking. Use salt-free seasonings or herbs instead of table salt or sea salt. Check with your health care provider or pharmacist before using salt substitutes.  Do not fry foods. Cook foods using healthy methods such as baking, boiling, grilling, and broiling instead.  Cook with heart-healthy oils, such as olive, canola, soybean, or sunflower oil. Meal planning  Eat a balanced diet that includes: ? 5 or more servings of fruits and vegetables each day. At each meal, try to fill half of your plate with fruits and vegetables. ? Up to 6-8 servings of whole grains each day. ? Less than 6 oz of lean meat, poultry, or fish each day. A 3-oz serving of meat is about the same size as a deck of cards. One egg equals 1 oz. ? 2 servings of low-fat dairy each day. ? A serving of nuts, seeds, or beans 5 times each week. ? Heart-healthy fats. Healthy fats called Omega-3 fatty acids are found in foods such as flaxseeds and coldwater fish, like sardines, salmon, and mackerel.  Limit how much you eat of the following: ? Canned  or prepackaged foods. ? Food that is high in trans fat, such as fried foods. ? Food that is high in saturated fat, such as fatty meat. ? Sweets, desserts, sugary drinks, and other foods with added sugar. ? Full-fat dairy products.  Do not salt foods before eating.  Try to eat at least 2 vegetarian meals each week.  Eat more home-cooked food and less restaurant, buffet, and fast food.  When eating at a restaurant, ask that your food be prepared with less salt or no salt, if possible. What foods are recommended? The items listed may not be a complete list. Talk with your dietitian about what dietary choices are best for you. Grains Whole-grain or whole-wheat bread. Whole-grain or whole-wheat pasta. Brown rice. Orpah Cobbatmeal. Quinoa. Bulgur. Whole-grain and low-sodium cereals. Pita bread. Low-fat, low-sodium crackers. Whole-wheat flour  tortillas. Vegetables Fresh or frozen vegetables (raw, steamed, roasted, or grilled). Low-sodium or reduced-sodium tomato and vegetable juice. Low-sodium or reduced-sodium tomato sauce and tomato paste. Low-sodium or reduced-sodium canned vegetables. Fruits All fresh, dried, or frozen fruit. Canned fruit in natural juice (without added sugar). Meat and other protein foods Skinless chicken or Malawiturkey. Ground chicken or Mal[place]urkey. Pork with fat trimmed off. Fish and seafood. Egg whites. Dried beans, peas, or lentils. Unsalted nuts, nut butters, and seeds. Unsalted canned beans. Lean cuts of beef with fat trimmed off. Low-sodium, lean deli meat. Dairy Low-fat (1%) or fat-free (skim) milk. Fat-free, low-fat, or reduced-fat cheeses. Nonfat, low-sodium ricotta or cottage cheese. Low-fat or nonfat yogurt. Low-fat, low-sodium cheese. Fats and oils Soft margarine without trans fats. Vegetable oil. Low-fat, reduced-fat, or light mayonnaise and salad dressings (reduced-sodium). Canola, safflower, olive, soybean, and sunflower oils. Avocado. Seasoning and other foods Herbs. Spices. Seasoning mixes without salt. Unsalted popcorn and pretzels. Fat-free sweets. What foods are not recommended? The items listed may not be a complete list. Talk with your dietitian about what dietary choices are best for you. Grains Baked goods made with fat, such as croissants, muffins, or some breads. Dry pasta or rice meal packs. Vegetables Creamed or fried vegetables. Vegetables in a cheese sauce. Regular canned vegetables (not low-sodium or reduced-sodium). Regular canned tomato sauce and paste (not low-sodium or reduced-sodium). Regular tomato and vegetable juice (not low-sodium or reduced-sodium). Rosita FirePickles. Olives. Fruits Canned fruit in a light or heavy syrup. Fried fruit. Fruit in cream or butter sauce. Meat and other protein foods Fatty cuts of meat. Ribs. Fried meat. Tomasa BlaseBacon. Sausage. Bologna and other processed lunch meats.  Salami. Fatback. Hotdogs. Bratwurst. Salted nuts and seeds. Canned beans with added salt. Canned or smoked fish. Whole eggs or egg yolks. Chicken or Malawiturkey with skin. Dairy Whole or 2% milk, cream, and half-and-half. Whole or full-fat cream cheese. Whole-fat or sweetened yogurt. Full-fat cheese. Nondairy creamers. Whipped toppings. Processed cheese and cheese spreads. Fats and oils Butter. Stick margarine. Lard. Shortening. Ghee. Bacon fat. Tropical oils, such as coconut, palm kernel, or palm oil. Seasoning and other foods Salted popcorn and pretzels. Onion salt, garlic salt, seasoned salt, table salt, and sea salt. Worcestershire sauce. Tartar sauce. Barbecue sauce. Teriyaki sauce. Soy sauce, including reduced-sodium. Steak sauce. Canned and packaged gravies. Fish sauce. Oyster sauce. Cocktail sauce. Horseradish that you find on the shelf. Ketchup. Mustard. Meat flavorings and tenderizers. Bouillon cubes. Hot sauce and Tabasco sauce. Premade or packaged marinades. Premade or packaged taco seasonings. Relishes. Regular salad dressings. Where to find more information:  National Heart, Lung, and Blood Institute: PopSteam.iswww.nhlbi.nih.gov  American Heart Association: www.heart.org Summary  The  DASH eating plan is a healthy eating plan that has been shown to reduce high blood pressure (hypertension). It may also reduce your risk for type 2 diabetes, heart disease, and stroke.  With the DASH eating plan, you should limit salt (sodium) intake to 2,300 mg a day. If you have hypertension, you may need to reduce your sodium intake to 1,500 mg a day.  When on the DASH eating plan, aim to eat more fresh fruits and vegetables, whole grains, lean proteins, low-fat dairy, and heart-healthy fats.  Work with your health care provider or diet and nutrition specialist (dietitian) to adjust your eating plan to your individual calorie needs. This information is not intended to replace advice given to you by your health  care provider. Make sure you discuss any questions you have with your health care provider. Document Released: 06/25/2011 Document Revised: 06/29/2016 Document Reviewed: 06/29/2016 Elsevier Interactive Patient Education  2019 [employer]

## 2018-08-04 LAB — TESTOSTERONE: TESTOSTERONE: 286 ng/dL (ref 264–916)

## 2018-08-24 ENCOUNTER — Ambulatory Visit: Payer: Self-pay | Admitting: Gerontology

## 2018-10-26 ENCOUNTER — Other Ambulatory Visit: Payer: Self-pay | Admitting: Gerontology

## 2018-10-26 DIAGNOSIS — K219 Gastro-esophageal reflux disease without esophagitis: Secondary | ICD-10-CM

## 2018-12-17 ENCOUNTER — Emergency Department
Admission: EM | Admit: 2018-12-17 | Discharge: 2018-12-17 | Disposition: A | Discharge status: Home / Self Care | Payer: Self-pay | Attending: Emergency Medicine | Admitting: Emergency Medicine

## 2018-12-17 ENCOUNTER — Other Ambulatory Visit: Payer: Self-pay

## 2018-12-17 ENCOUNTER — Encounter: Payer: Self-pay | Admitting: Emergency Medicine

## 2018-12-17 DIAGNOSIS — M436 Torticollis: Secondary | ICD-10-CM | POA: Insufficient documentation

## 2018-12-17 DIAGNOSIS — I10 Essential (primary) hypertension: Secondary | ICD-10-CM | POA: Insufficient documentation

## 2018-12-17 DIAGNOSIS — M5412 Radiculopathy, cervical region: Secondary | ICD-10-CM | POA: Insufficient documentation

## 2018-12-17 DIAGNOSIS — Z79899 Other long term (current) drug therapy: Secondary | ICD-10-CM | POA: Insufficient documentation

## 2018-12-17 MED ORDER — OXYCODONE-ACETAMINOPHEN 5-325 MG PO TABS: 1.0000 | ORAL_TABLET | Freq: Four times a day (QID) | ORAL | 0 refills | Status: DC | PRN

## 2018-12-17 MED ORDER — METHOCARBAMOL 500 MG PO TABS: 500.0000 mg | ORAL_TABLET | Freq: Four times a day (QID) | ORAL | 0 refills | Status: DC

## 2018-12-17 MED ORDER — ORPHENADRINE CITRATE 30 MG/ML IJ SOLN
60.0000 mg | Freq: Once | INTRAMUSCULAR | Status: AC
  Administered 2018-12-17: 60 mg via INTRAMUSCULAR
  Filled 2018-12-17: qty 2

## 2018-12-17 MED ORDER — PREDNISONE 50 MG PO TABS: 50.0000 mg | ORAL_TABLET | Freq: Every day | ORAL | 0 refills | Status: DC

## 2018-12-17 MED ORDER — OXYCODONE-ACETAMINOPHEN 5-325 MG PO TABS
1.0000 | ORAL_TABLET | Freq: Once | ORAL | Status: AC
  Administered 2018-12-17: 1 via ORAL
  Filled 2018-12-17: qty 1

## 2018-12-17 MED ORDER — DEXAMETHASONE SODIUM PHOSPHATE 10 MG/ML IJ SOLN
10.0000 mg | Freq: Once | INTRAMUSCULAR | Status: AC
Start: 2018-12-17 — End: 2018-12-17
  Administered 2018-12-17: 10 mg via INTRAMUSCULAR
  Filled 2018-12-17: qty 1

## 2018-12-17 NOTE — ED Notes (Addendum)
Pt reports that he has a pinched nerve "in neck" that was dx 20 years - pt reports that this am he started with aching/sharp pain in left shoulder and arm - he reports turning head/moving left arm in certain ways causes him 10/10 pain and "brings me to my knees" - c/o pain in armpit area too - c/o numbness and tingling to the outside of left hand and pinky - pt reports that yesterday he picked up an 18inch tire and that is the only thing that he has done out of the normal (he had no pain after lifting the tire)

## 2018-12-17 NOTE — ED Notes (Signed)
Pt given sprite to drink. 

## 2018-12-17 NOTE — ED Triage Notes (Signed)
States has history of neck and shoulder pain for you, today worse with numbness going down arm. Denies new injury.

## 2018-12-17 NOTE — ED Provider Notes (Signed)
[facility] Emergency Department Provider Note  ____________________________________________  Time seen: Approximately 5:40 PM  I have reviewed the triage vital signs and the nursing notes.   HISTORY  Chief Complaint Neck Pain    HPI Wayne Brown is a 53 y.o. male who presents the emergency department complaining of neck pain, stiffness, radiation of pain in the left arm.  Patient has a history of known "pinched nerve" in his neck.  Patient reports that he has flares where he will have increased symptoms.  Patient reports that he was lifting heavy items yesterday, slept at an awkward position in a chair last night and woke up with left-sided neck pain with pain radiating down the left arm.  He does endorse some numbness and tingling that is intermittent.  Patient denies any cold sensation of the left arm.  No direct trauma to the neck.  Patient denies any headache, fevers or chills, chest pain, abdominal pain, nausea vomiting.         Past Medical History:  Diagnosis Date  . Bipolar disorder (HCC)   . GERD (gastroesophageal reflux disease)   . Gout   . Hypertension   . Post traumatic stress disorder     Patient Active Problem List   Diagnosis Date Noted  . Hypertension 07/23/2016  . Restless leg syndrome 05/28/2016  . Foot pain, left 05/28/2016  . Ear pain, right 05/28/2016  . Breast pain, left 12/12/2015  . Shoulder pain, right 12/12/2015  . Alkaline phosphatase elevation 11/14/2015  . Atypical chest pain 11/14/2015  . PTSD (post-traumatic stress disorder) 11/14/2015  . Dysphagia 11/14/2015  . Abnormal weight gain 11/14/2015  . Acute gout 11/14/2015  . GERD (gastroesophageal reflux disease) 09/05/2015  . Pain and swelling of wrist 09/05/2015    Past Surgical History:  Procedure Laterality Date  . TONSILLECTOMY  53 yrs old    Prior to Admission medications   Medication Sig Start Date End Date Taking? Authorizing Provider  clonazePAM  (KLONOPIN) 0.5 MG tablet Take 0.5 mg by mouth 2 (two) times daily.    [provider]  doxycycline (VIBRAMYCIN) 50 MG capsule Take 2 capsules (100 mg total) by mouth 2 (two) times daily. 08/03/18   Iloabachie, Chioma E, NP  hydrOXYzine (ATARAX/VISTARIL) 10 MG tablet Take 10 mg by mouth 3 (three) times daily as needed.     [provider]  ibuprofen (ADVIL,MOTRIN) 800 MG tablet Take 1 tablet (800 mg total) by mouth every 8 (eight) hours as needed. Patient not taking: Reported on 08/03/2018 09/16/17   [name], PA-C  lisinopril (PRINIVIL,ZESTRIL) 10 MG tablet Take 1 tablet (10 mg total) by mouth daily. 05/04/18   Virl Axe, MD  meloxicam (MOBIC) 15 MG tablet Take 1 tablet (15 mg total) by mouth daily. Patient not taking: Reported on 08/03/2018 05/16/18   Cuthriell, Delorise Royals, PA-C  methocarbamol (ROBAXIN) 500 MG tablet Take 1 tablet (500 mg total) by mouth 4 (four) times daily. Patient not taking: Reported on 08/03/2018 05/16/18   Cuthriell, Delorise Royals, PA-C  methocarbamol (ROBAXIN) 500 MG tablet Take 1 tablet (500 mg total) by mouth 4 (four) times daily. 12/17/18   Cuthriell, Delorise Royals, PA-C  omeprazole (PRILOSEC) 20 MG capsule TAKE ONE CAPSULE BY MOUTH EVERY DAY 10/27/18   Virl Axe, MD  oxyCODONE-acetaminophen (PERCOCET/ROXICET) 5-325 MG tablet Take 1 tablet by mouth every 6 (six) hours as needed for severe pain. Patient not taking: Reported on 08/03/2018 05/16/18   Cuthriell, Delorise Royals,  PA-C  oxyCODONE-acetaminophen (PERCOCET/ROXICET) 5-325 MG tablet Take 1 tablet by mouth every 6 (six) hours as needed for severe pain. 12/17/18   Cuthriell, Delorise Royals, PA-C  predniSONE (DELTASONE) 50 MG tablet Take 1 tablet (50 mg total) by mouth daily with breakfast. 12/17/18   Cuthriell, Delorise Royals, PA-C    Allergies Erythromycin; Toradol [ketorolac tromethamine]; Tramadol; Vicodin [hydrocodone-acetaminophen]; Lithium; and Penicillins  Family History  Problem Relation Age of Onset   . Cancer Father 32  . Heart disease Father   . Diabetes Father   . Cancer Mother 73       Colon Cancer  . Breast cancer Neg Hx     Social History Social History   Tobacco Use  . Smoking status: Never Smoker  . Smokeless tobacco: Never Used  Substance Use Topics  . Alcohol use: Not Currently    Alcohol/week: 0.0 standard drinks    Comment: holidays 1 drink  . Drug use: No    Types: Marijuana    Comment:  former use at age 89      Review of Systems  Constitutional: No fever/chills Eyes: No visual changes. No discharge ENT: No upper respiratory complaints. Cardiovascular: no chest pain. Respiratory: no cough. No SOB. Gastrointestinal: No abdominal pain.  No nausea, no vomiting.  Musculoskeletal: Positive for left-sided neck and left arm numbness and tingling. Skin: Negative for rash, abrasions, lacerations, ecchymosis. Neurological: Negative for headaches, focal weakness or numbness. 10-point ROS otherwise negative.  ____________________________________________   PHYSICAL EXAM:  VITAL SIGNS: ED Triage Vitals  Enc Vitals Group     BP 12/17/18 1514 128/74     Pulse Rate 12/17/18 1514 68     Resp 12/17/18 1514 20     Temp 12/17/18 1514 98.5 F (36.9 C)     Temp Source 12/17/18 1514 Oral     SpO2 12/17/18 1514 95 %     Weight 12/17/18 1516 262 lb (118.8 kg)     Height 12/17/18 1516  (1.956 m)     Head Circumference --      Peak Flow --      Pain Score 12/17/18 1516 9     Pain Loc --      Pain Edu? --      Excl. in GC? --      Constitutional: Alert and oriented. Well appearing and in no acute distress. Eyes: Conjunctivae are normal. PERRL. EOMI. Head: Atraumatic. Neck: No stridor.    Cardiovascular: Normal rate, regular rhythm. Normal S1 and S2.  Good peripheral circulation. Respiratory: Normal respiratory effort without tachypnea or retractions. Lungs CTAB. Good air entry to the bases with no decreased or absent breath sounds. Musculoskeletal: Full  range of motion to all extremities. No gross deformities appreciated.  Patient with obvious torticollis on exam.  Very tender to palpation with appreciable muscular spasms in the left trapezius muscle group.  Good range of motion to the left shoulder, left elbow, left wrist.  Radial pulse intact distally.  Sensation intact in all dermatomal distributions bilaterally. Neurologic:  Normal speech and language. No gross focal neurologic deficits are appreciated.  Skin:  Skin is warm, dry and intact. No rash noted. Psychiatric: Mood and affect are normal. Speech and behavior are normal. Patient exhibits appropriate insight and judgement.   ____________________________________________   LABS (all labs ordered are listed, but only abnormal results are displayed)  Labs Reviewed - No data to display ____________________________________________  EKG   ____________________________________________  RADIOLOGY   No results found.  ____________________________________________    PROCEDURES  Procedure(s) performed:    Procedures    Medications  dexamethasone (DECADRON) injection 10 mg (10 mg Intramuscular Given 12/17/18 1815)  orphenadrine (NORFLEX) injection 60 mg (60 mg Intramuscular Given 12/17/18 1814)  oxyCODONE-acetaminophen (PERCOCET/ROXICET) 5-325 MG per tablet 1 tablet (1 tablet Oral Given 12/17/18 1814)     ____________________________________________   INITIAL IMPRESSION / ASSESSMENT AND PLAN / ED COURSE  Pertinent labs & imaging results that were available during my care of the patient were reviewed by me and considered in my medical decision making (see chart for details).  Review of the [place] CSRS was performed in accordance of the [facility] prior to dispensing any controlled drugs.           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  Patient presented to the emergency department with a complaint of left-sided neck pain.  Patient has  a history of "pinched nerve."  Patient does not remember which level C-spine he has impingement.  Good range of motion.  Patient is neurologically intact.  Differential included herniated disc, meningitis, ACS, cervical radiculopathy.  Exam, symptoms, history is most consistent with cervical radiculopathy from degenerative changes in the cervical spine.  No indication for imaging today.  Patient is given Decadron, Norflex injections in the emergency department.  He will be prescribed short course of prednisone, muscle relaxer, limited pain medication.  Follow-up with orthopedics as needed. Patient is given ED precautions to return to the ED for any worsening or new symptoms.     ____________________________________________  FINAL CLINICAL IMPRESSION(S) / ED DIAGNOSES  Final diagnoses:  Torticollis  Cervical radiculopathy      NEW MEDICATIONS STARTED DURING THIS VISIT:  ED Discharge Orders         Ordered    methocarbamol (ROBAXIN) 500 MG tablet  4 times daily     12/17/18 1830    predniSONE (DELTASONE) 50 MG tablet  Daily with breakfast     12/17/18 1830    oxyCODONE-acetaminophen (PERCOCET/ROXICET) 5-325 MG tablet  Every 6 hours PRN     12/17/18 1830              This chart was dictated using voice recognition software/Dragon. Despite best efforts to proofread, errors can occur which can change the meaning. Any change was purely unintentional.    Racheal PatchesCuthriell, Jonathan D, PA-C 12/17/18 1831    Sharman CheekStafford, Phillip, MD 12/18/18 0000

## 2019-02-09 ENCOUNTER — Ambulatory Visit: Payer: Self-pay

## 2019-02-09 ENCOUNTER — Other Ambulatory Visit: Payer: Self-pay | Admitting: Pharmacist

## 2019-02-14 ENCOUNTER — Ambulatory Visit: Payer: Self-pay | Admitting: Gerontology

## 2019-02-14 ENCOUNTER — Other Ambulatory Visit: Payer: Self-pay

## 2019-02-14 ENCOUNTER — Encounter: Payer: Self-pay | Admitting: Gerontology

## 2019-02-14 VITALS — BP 119/82 | HR 90 | Temp 97.8°F | Wt 271.2 lb

## 2019-02-14 DIAGNOSIS — Z0000 Encounter for general adult medical examination without abnormal findings: Secondary | ICD-10-CM

## 2019-02-14 DIAGNOSIS — R5383 Other fatigue: Secondary | ICD-10-CM

## 2019-02-14 DIAGNOSIS — R600 Localized edema: Secondary | ICD-10-CM | POA: Insufficient documentation

## 2019-02-14 DIAGNOSIS — R0602 Shortness of breath: Secondary | ICD-10-CM | POA: Insufficient documentation

## 2019-02-14 DIAGNOSIS — R7303 Prediabetes: Secondary | ICD-10-CM | POA: Insufficient documentation

## 2019-02-14 DIAGNOSIS — R202 Paresthesia of skin: Secondary | ICD-10-CM | POA: Insufficient documentation

## 2019-02-14 DIAGNOSIS — R200 Anesthesia of skin: Secondary | ICD-10-CM

## 2019-02-14 NOTE — Progress Notes (Signed)
Patient states he notices edema bi lateral in legs has noticed more since nerve pain in neck.  Lt arm will become paralyzed at times.  Noticed wheezing at night when he lays flat.  Has purchased compression socks and is wearing them as suggested.

## 2019-02-14 NOTE — Progress Notes (Signed)
Established Patient Office Visit  Subjective:  Patient ID: Wayne Brown, male    DOB: Oct 10, 1965  Age: 53 y.o. MRN: [id number]  CC:  Chief Complaint  Patient presents with  . Fatigue  . Neck Pain    with arm pain    HPI Wayne Brown presents for c/o constant numbness to left fore arm and 4th and 5th fingers. He was evaluated at the ED on 12/17/18 for left sided neck pain, stiffness that radiates to left arm and has a history of "pinched nerve to neck". He was treated with prednisone, Percocet and Methocarbamol. He then followed up at [facility] clinic by Coralie Keens A.A. PA on 12/23/18. His lumbar flexion extension film was reviewed with him and he was advised that his acute cervical radiculopathy may resolve in 6 weeks and cervical MRI, epidural steroid injections and physical therapy was recommended.  Currently he denies left neck pain, but states that he continues to experience constant left arm and finger numbers. He reports decrease dexterity and grip weakness to left hand and it affects his daily activities. He also report having bilateral lower extremity edema that has being going on for more than 2 months. He denies claudication and erythema. He reports wearing compression stockings and elevating legs with moderate relief. He also endorses intermittent wheezing while lying down, fatigue,and shortness of breath with exertion such as walking more that 3 blocks which might be due to gaining weight. He denies chest pain, palpitation, fever, chills and no further concerns.  Past Medical History:  Diagnosis Date  . Bipolar disorder ([place])   . GERD (gastroesophageal reflux disease)   . Gout   . Hypertension   . Post traumatic stress disorder     Past Surgical History:  Procedure Laterality Date  . TONSILLECTOMY  53 yrs old    Family History  Problem Relation Age of Onset  . Cancer Father 72  . Heart disease Father   . Diabetes Father   . Cancer Mother 12       Colon  Cancer  . Breast cancer Neg Hx     Social History   Socioeconomic History  . Marital status: Single    Spouse name: Not on file  . Number of children: Not on file  . Years of education: Not on file  . Highest education level: Not on file  Occupational History  . Not on file  Social Needs  . Financial resource strain: Not on file  . Food insecurity    Worry: Not on file    Inability: Not on file  . Transportation needs    Medical: Not on file    Non-medical: Not on file  Tobacco Use  . Smoking status: Never Smoker  . Smokeless tobacco: Never Used  Substance and Sexual Activity  . Alcohol use: Not Currently    Alcohol/week: 0.0 standard drinks    Comment: holidays 1 drink  . Drug use: No    Types: Marijuana    Comment:  former use at age 45   . Sexual activity: Yes    Comment: monogomus   Lifestyle  . Physical activity    Days per week: Not on file    Minutes per session: Not on file  . Stress: Not on file  Relationships  . Social Herbalist on phone: Not on file    Gets together: Not on file    Attends religious service: Not on file    Active  member of club or organization: Not on file    Attends meetings of clubs or organizations: Not on file    Relationship status: Not on file  . Intimate partner violence    Fear of current or ex partner: Not on file    Emotionally abused: Not on file    Physically abused: Not on file    Forced sexual activity: Not on file  Other Topics Concern  . Not on file  Social History Narrative  . Not on file    Outpatient Medications Prior to Visit  Medication Sig Dispense Refill  . lisinopril (PRINIVIL,ZESTRIL) 10 MG tablet Take 1 tablet (10 mg total) by mouth daily. 90 tablet 3  . methocarbamol (ROBAXIN) 500 MG tablet Take 1 tablet (500 mg total) by mouth 4 (four) times daily. 16 tablet 0  . omeprazole (PRILOSEC) 20 MG capsule TAKE ONE CAPSULE BY MOUTH EVERY DAY 90 capsule 0  . clonazePAM (KLONOPIN) 0.5 MG tablet  Take 0.5 mg by mouth 2 (two) times daily.    . hydrOXYzine (ATARAX/VISTARIL) 10 MG tablet Take 10 mg by mouth 3 (three) times daily as needed.     . doxycycline (VIBRAMYCIN) 50 MG capsule Take 2 capsules (100 mg total) by mouth 2 (two) times daily. (Patient not taking: Reported on 02/14/2019) 14 capsule 0  . ibuprofen (ADVIL,MOTRIN) 800 MG tablet Take 1 tablet (800 mg total) by mouth every 8 (eight) hours as needed. (Patient not taking: Reported on 08/03/2018) 30 tablet 0  . meloxicam (MOBIC) 15 MG tablet Take 1 tablet (15 mg total) by mouth daily. (Patient not taking: Reported on 08/03/2018) 30 tablet 0  . methocarbamol (ROBAXIN) 500 MG tablet Take 1 tablet (500 mg total) by mouth 4 (four) times daily. 16 tablet 0  . oxyCODONE-acetaminophen (PERCOCET/ROXICET) 5-325 MG tablet Take 1 tablet by mouth every 6 (six) hours as needed for severe pain. (Patient not taking: Reported on 08/03/2018) 10 tablet 0  . oxyCODONE-acetaminophen (PERCOCET/ROXICET) 5-325 MG tablet Take 1 tablet by mouth every 6 (six) hours as needed for severe pain. (Patient not taking: Reported on 02/14/2019) 16 tablet 0  . predniSONE (DELTASONE) 50 MG tablet Take 1 tablet (50 mg total) by mouth daily with breakfast. (Patient not taking: Reported on 02/14/2019) 5 tablet 0   No facility-administered medications prior to visit.     Allergies  Allergen Reactions  . Erythromycin Other (See Comments)    Depression per pt  . Toradol [Ketorolac Tromethamine] Nausea And Vomiting  . Tramadol Nausea And Vomiting  . Vicodin [Hydrocodone-Acetaminophen] Nausea And Vomiting  . Lithium Rash    "lithium rash" per pt  . Penicillins Rash    Per pt rxn was an infant, familial    ROS Review of Systems  Constitutional: Positive for fatigue.  HENT: Negative.   Respiratory: Positive for shortness of breath and wheezing.   Cardiovascular: Positive for leg swelling (bilateral leg).  Genitourinary: Negative.   Musculoskeletal: Negative.   Skin:  Negative.   Neurological: Positive for numbness (to left arm and 4 &5 th fingers.).  Psychiatric/Behavioral: Negative.       Objective:    Physical Exam  Constitutional: He is oriented to person, place, and time. He appears well-developed and well-nourished.  HENT:  Head: Normocephalic and atraumatic.  Eyes: Pupils are equal, round, and reactive to light.  Cardiovascular: Normal rate, regular rhythm and normal heart sounds.  Pulmonary/Chest: Effort normal and breath sounds normal.  Abdominal: Soft. Bowel sounds are normal.  Musculoskeletal:  General: Edema (+ 2 pitting edema to bilateral lower extremities) present.       Hands:  Neurological: He is alert and oriented to person, place, and time. He has normal reflexes.  Weak left hand grip  Skin: Skin is warm and dry.  Psychiatric: He has a normal mood and affect. His behavior is normal. Judgment and thought content normal.    BP 119/82   Pulse 90   Temp 97.8 F (36.6 C) (Temporal)   Wt 271 lb 3.2 oz (123 kg)   BMI 32.16 kg/m  Wt Readings from Last 3 Encounters:  02/14/19 271 lb 3.2 oz (123 kg)  12/17/18 262 lb (118.8 kg)  08/03/18 258 lb 4.8 oz (117.2 kg)     Health Maintenance Due  Topic Date Due  . HIV Screening  12/03/1982  . TETANUS/TDAP  12/03/1986  . COLONOSCOPY  12/02/2017    There are no preventive care reminders to display for this patient.  Lab Results  Component Value Date   TSH 3.250 05/04/2018   Lab Results  Component Value Date   WBC 8.6 05/04/2018   HGB 14.2 05/04/2018   HCT 41.9 05/04/2018   MCV 90 05/04/2018   PLT 233 05/04/2018   Lab Results  Component Value Date   NA 141 05/04/2018   K 4.1 05/04/2018   CO2 23 05/04/2018   GLUCOSE 95 05/04/2018   BUN 14 05/04/2018   CREATININE 1.11 05/04/2018   BILITOT 0.5 05/04/2018   ALKPHOS 120 (H) 05/04/2018   AST 17 05/04/2018   ALT 27 05/04/2018   PROT 6.8 05/04/2018   ALBUMIN 4.4 05/04/2018   CALCIUM 9.3 05/04/2018   ANIONGAP 6  (L) 10/06/2013   Lab Results  Component Value Date   CHOL 151 05/04/2018   Lab Results  Component Value Date   HDL 36 (L) 05/04/2018   Lab Results  Component Value Date   LDLCALC 92 05/04/2018   Lab Results  Component Value Date   TRIG 116 05/04/2018   Lab Results  Component Value Date   CHOLHDL 4.2 05/04/2018   Lab Results  Component Value Date   HGBA1C 5.8 (H) 05/04/2018      Assessment & Plan:     1. Numbness and tingling in left arm - He was encouraged to complete charity care application for - Ambulatory referral to Neurology; Future  2. Bilateral lower extremity edema - Labs will be checked to rule out CHF. -Elevate your legs up above heart level while resting - Wear compression stockings if standing or walking for a while -Reduce soium intake and limit fluid intake -Exercise daily as tolerated. - Urinalysis; Future - B Nat Peptide; Future - EKG 12-Lead; Future  3. Other fatigue - TSH; Future - B12 and Folate Panel; Future - EKG 12-Lead; Future  4. Shortness of breath - He was advised to notify clinic or go to the ED for worsening symptoms. Activity as tolerated. - He was advised on weight loss regimen.  5. Prediabetes - His - HgB A1c; will be rechecked and was advised to continue on low carb, low concentrated sweet diet.  6. Health care maintenance - Lipid panel; will be rechecked.   Follow-up: Return in about 8 days (around 02/22/2019), or if symptoms worsen or fail to improve.    Rodrigus Kilker Trellis PaganiniE Lisl Slingerland, NP

## 2019-02-15 ENCOUNTER — Other Ambulatory Visit: Payer: Self-pay

## 2019-02-15 DIAGNOSIS — R600 Localized edema: Secondary | ICD-10-CM

## 2019-02-15 DIAGNOSIS — R5383 Other fatigue: Secondary | ICD-10-CM

## 2019-02-15 DIAGNOSIS — R7303 Prediabetes: Secondary | ICD-10-CM

## 2019-02-15 DIAGNOSIS — Z0000 Encounter for general adult medical examination without abnormal findings: Secondary | ICD-10-CM

## 2019-02-16 LAB — URINALYSIS
Bilirubin, UA: NEGATIVE
Ketones, UA: NEGATIVE
Leukocytes,UA: NEGATIVE
Nitrite, UA: NEGATIVE
Protein,UA: NEGATIVE
RBC, UA: NEGATIVE
Specific Gravity, UA: 1.024 (ref 1.005–1.030)
Urobilinogen, Ur: 0.2 mg/dL (ref 0.2–1.0)
pH, UA: 5 (ref 5.0–7.5)

## 2019-02-16 LAB — LIPID PANEL
Chol/HDL Ratio: 4.7 ratio (ref 0.0–5.0)
Cholesterol, Total: 145 mg/dL (ref 100–199)
HDL: 31 mg/dL — ABNORMAL LOW (ref >39)
LDL Calculated: 85 mg/dL (ref 0–99)
Triglycerides: 144 mg/dL (ref 0–149)
VLDL Cholesterol Cal: 29 mg/dL (ref 5–40)

## 2019-02-16 LAB — HEMOGLOBIN A1C
Est. average glucose Bld gHb Est-mCnc: 120 mg/dL
Hgb A1c MFr Bld: 5.8 % — ABNORMAL HIGH (ref 4.8–5.6)

## 2019-02-16 LAB — BRAIN NATRIURETIC PEPTIDE: BNP: 8.2 pg/mL (ref 0.0–100.0)

## 2019-02-16 LAB — B12 AND FOLATE PANEL
Folate: 10.5 ng/mL (ref >3.0)
Vitamin B-12: 270 pg/mL (ref 232–1245)

## 2019-02-16 LAB — TSH: TSH: 2.13 u[IU]/mL (ref 0.450–4.500)

## 2019-02-22 ENCOUNTER — Other Ambulatory Visit: Payer: Self-pay

## 2019-02-22 ENCOUNTER — Ambulatory Visit: Payer: Self-pay | Admitting: Gerontology

## 2019-02-22 ENCOUNTER — Encounter: Payer: Self-pay | Admitting: Gerontology

## 2019-02-22 DIAGNOSIS — R7303 Prediabetes: Secondary | ICD-10-CM

## 2019-02-22 DIAGNOSIS — R0602 Shortness of breath: Secondary | ICD-10-CM

## 2019-02-22 DIAGNOSIS — R202 Paresthesia of skin: Secondary | ICD-10-CM

## 2019-02-22 DIAGNOSIS — R600 Localized edema: Secondary | ICD-10-CM

## 2019-02-22 DIAGNOSIS — Z0000 Encounter for general adult medical examination without abnormal findings: Secondary | ICD-10-CM

## 2019-02-22 NOTE — Progress Notes (Signed)
Established Patient Office Visit  Subjective:  Patient ID: Wayne Brown, male    DOB: 1966-05-20  Age: 53 y.o. MRN: [id number]  CC:  Chief Complaint  Patient presents with  . Follow-up    continues to have numbness and tingling in left arm, review recent lab   Patient consents to telephone visit and 2 patient identifiers was used to identify patient.  HPI Wayne Brown presents for follow up of numbness and tingling to left fore arm, 4th and 5th fingers, shortness of breath, bilateral lower extremity edema and lab review. He reports that numbness to his left fore arm, 4 & 5 th fingers remain the same since his last office visit. His B12 and folate panel was normal. He reports that edema to his bilateral lower extremity improves if he wears compression stockings and elevate legs while sitting down. He continues to experience intermittent SOB with exertion and he states that it is likely due to his weight gain.His BNP done a week ago was normal and CHF was ruled out. His HgbA1c done a week ago was 5.8%, and he defers starting on Metformin and will make some lifestyle modifications. He denies chest pain, palpitation, fever, chills and no further concerns.  Past Medical History:  Diagnosis Date  . Bipolar disorder ([place])   . GERD (gastroesophageal reflux disease)   . Gout   . Hypertension   . Post traumatic stress disorder     Past Surgical History:  Procedure Laterality Date  . TONSILLECTOMY  53 yrs old    Family History  Problem Relation Age of Onset  . Cancer Father 45  . Heart disease Father   . Diabetes Father   . Cancer Mother 6       Colon Cancer  . Breast cancer Neg Hx     Social History   Socioeconomic History  . Marital status: Single    Spouse name: Not on file  . Number of children: Not on file  . Years of education: Not on file  . Highest education level: Not on file  Occupational History  . Not on file  Social Needs  . Financial resource strain:  Not on file  . Food insecurity    Worry: Not on file    Inability: Not on file  . Transportation needs    Medical: Not on file    Non-medical: Not on file  Tobacco Use  . Smoking status: Never Smoker  . Smokeless tobacco: Never Used  Substance and Sexual Activity  . Alcohol use: Not Currently    Alcohol/week: 0.0 standard drinks    Comment: holidays 1 drink  . Drug use: No    Types: Marijuana    Comment:  former use at age 29   . Sexual activity: Yes    Comment: monogomus   Lifestyle  . Physical activity    Days per week: Not on file    Minutes per session: Not on file  . Stress: Not on file  Relationships  . Social Herbalist on phone: Not on file    Gets together: Not on file    Attends religious service: Not on file    Active member of club or organization: Not on file    Attends meetings of clubs or organizations: Not on file    Relationship status: Not on file  . Intimate partner violence    Fear of current or ex partner: Not on file    Emotionally  abused: Not on file    Physically abused: Not on file    Forced sexual activity: Not on file  Other Topics Concern  . Not on file  Social History Narrative  . Not on file    Outpatient Medications Prior to Visit  Medication Sig Dispense Refill  . lisinopril (PRINIVIL,ZESTRIL) 10 MG tablet Take 1 tablet (10 mg total) by mouth daily. 90 tablet 3  . omeprazole (PRILOSEC) 20 MG capsule TAKE ONE CAPSULE BY MOUTH EVERY DAY 90 capsule 0  . clonazePAM (KLONOPIN) 0.5 MG tablet Take 0.5 mg by mouth 2 (two) times daily.    . hydrOXYzine (ATARAX/VISTARIL) 10 MG tablet Take 10 mg by mouth 3 (three) times daily as needed.     . methocarbamol (ROBAXIN) 500 MG tablet Take 1 tablet (500 mg total) by mouth 4 (four) times daily. (Patient not taking: Reported on 02/22/2019) 16 tablet 0   No facility-administered medications prior to visit.     Allergies  Allergen Reactions  . Erythromycin Other (See Comments)    Depression  per pt  . Toradol [Ketorolac Tromethamine] Nausea And Vomiting  . Tramadol Nausea And Vomiting  . Vicodin [Hydrocodone-Acetaminophen] Nausea And Vomiting  . Lithium Rash    "lithium rash" per pt  . Penicillins Rash    Per pt rxn was an infant, familial    ROS Review of Systems  Constitutional: Negative.   Respiratory: Positive for shortness of breath.   Cardiovascular: Positive for leg swelling.  Genitourinary: Negative.   Skin: Negative.   Neurological: Positive for numbness.  Psychiatric/Behavioral: Negative.       Objective:    Physical Exam No vital sign or PE was done There were no vitals taken for this visit. Wt Readings from Last 3 Encounters:  02/14/19 271 lb 3.2 oz (123 kg)  12/17/18 262 lb (118.8 kg)  08/03/18 258 lb 4.8 oz (117.2 kg)  He was encouraged to continue on weight loss regimen, decrease calorie intake, exercise as tolerated.   Health Maintenance Due  Topic Date Due  . HIV Screening  12/03/1982  . TETANUS/TDAP  12/03/1986  . COLONOSCOPY  12/02/2017  . INFLUENZA VACCINE  02/18/2019    There are no preventive care reminders to display for this patient.  Lab Results  Component Value Date   TSH 2.130 02/15/2019   Lab Results  Component Value Date   WBC 8.6 05/04/2018   HGB 14.2 05/04/2018   HCT 41.9 05/04/2018   MCV 90 05/04/2018   PLT 233 05/04/2018   Lab Results  Component Value Date   NA 141 05/04/2018   K 4.1 05/04/2018   CO2 23 05/04/2018   GLUCOSE 95 05/04/2018   BUN 14 05/04/2018   CREATININE 1.11 05/04/2018   BILITOT 0.5 05/04/2018   ALKPHOS 120 (H) 05/04/2018   AST 17 05/04/2018   ALT 27 05/04/2018   PROT 6.8 05/04/2018   ALBUMIN 4.4 05/04/2018   CALCIUM 9.3 05/04/2018   ANIONGAP 6 (L) 10/06/2013   Lab Results  Component Value Date   CHOL 145 02/15/2019   Lab Results  Component Value Date   HDL 31 (L) 02/15/2019   Lab Results  Component Value Date   LDLCALC 85 02/15/2019   Lab Results  Component Value Date    TRIG 144 02/15/2019   Lab Results  Component Value Date   CHOLHDL 4.7 02/15/2019   Lab Results  Component Value Date   HGBA1C 5.8 (H) 02/15/2019      Assessment &  Plan:     1. Prediabetes - He was advised to continue on low carb/low concentrated sweet diet, exercise as tolerated. Will recheck - HgB A1c; Future  2. Health care maintenance Routine lab will be rechecked - Comp Met (CMET); Future  3. Paresthesia of left arm - He was advised to follow up with Neurology.  4. Bilateral lower extremity edema --Elevate your legs up above heart level while resting - Wear compression stockings if standing or walking for a while -Reduce sodium intake and limit fluid intake -Exercise daily as tolerated.  5. Shortness of breath - He was advised to continue on weight loss regimen.   Follow-up: Return in about 3 months (around 05/23/2019), or if symptoms worsen or fail to improve.    Auriella Wieand Jerold Coombe, NP

## 2019-02-22 NOTE — Patient Instructions (Signed)
Carbohydrate Counting for Diabetes Mellitus, Adult  Carbohydrate counting is a method of keeping track of how many carbohydrates you eat. Eating carbohydrates naturally increases the amount of sugar (glucose) in the blood. Counting how many carbohydrates you eat helps keep your blood glucose within normal limits, which helps you manage your diabetes (diabetes mellitus). It is important to know how many carbohydrates you can safely have in each meal. This is different for every person. A diet and nutrition specialist (registered dietitian) can help you make a meal plan and calculate how many carbohydrates you should have at each meal and snack. Carbohydrates are found in the following foods:  Grains, such as breads and cereals.  Dried beans and soy products.  Starchy vegetables, such as potatoes, peas, and corn.  Fruit and fruit juices.  Milk and yogurt.  Sweets and snack foods, such as cake, cookies, candy, chips, and soft drinks. How do I count carbohydrates? There are two ways to count carbohydrates in food. You can use either of the methods or a combination of both. Reading "Nutrition Facts" on packaged food The "Nutrition Facts" list is included on the labels of almost all packaged foods and beverages in the [place] It includes:  The serving size.  Information about nutrients in each serving, including the grams (g) of carbohydrate per serving. To use the "Nutrition Facts":  Decide how many servings you will have.  Multiply the number of servings by the number of carbohydrates per serving.  The resulting number is the total amount of carbohydrates that you will be having. Learning standard serving sizes of other foods When you eat carbohydrate foods that are not packaged or do not include "Nutrition Facts" on the label, you need to measure the servings in order to count the amount of carbohydrates:  Measure the foods that you will eat with a food scale or measuring cup, if needed.   Decide how many standard-size servings you will eat.  Multiply the number of servings by 15. Most carbohydrate-rich foods have about 15 g of carbohydrates per serving. ? For example, if you eat 8 oz (170 g) of strawberries, you will have eaten 2 servings and 30 g of carbohydrates (2 servings x 15 g = 30 g).  For foods that have more than one food mixed, such as soups and casseroles, you must count the carbohydrates in each food that is included. The following list contains standard serving sizes of common carbohydrate-rich foods. Each of these servings has about 15 g of carbohydrates:   hamburger bun or  English muffin.   oz (15 mL) syrup.   oz (14 g) jelly.  1 slice of bread.  1 six-inch tortilla.  3 oz (85 g) cooked rice or pasta.  4 oz (113 g) cooked dried beans.  4 oz (113 g) starchy vegetable, such as peas, corn, or potatoes.  4 oz (113 g) hot cereal.  4 oz (113 g) mashed potatoes or  of a large baked potato.  4 oz (113 g) canned or frozen fruit.  4 oz (120 mL) fruit juice.  4-6 crackers.  6 chicken nuggets.  6 oz (170 g) unsweetened dry cereal.  6 oz (170 g) plain fat-free yogurt or yogurt sweetened with artificial sweeteners.  8 oz (240 mL) milk.  8 oz (170 g) fresh fruit or one small piece of fruit.  24 oz (680 g) popped popcorn. Example of carbohydrate counting Sample meal  3 oz (85 g) chicken breast.  6 oz (170 g)   brown rice.  4 oz (113 g) corn.  8 oz (240 mL) milk.  8 oz (170 g) strawberries with sugar-free whipped topping. Carbohydrate calculation 1. Identify the foods that contain carbohydrates: ? Rice. ? Corn. ? Milk. ? Strawberries. 2. Calculate how many servings you have of each food: ? 2 servings rice. ? 1 serving corn. ? 1 serving milk. ? 1 serving strawberries. 3. Multiply each number of servings by 15 g: ? 2 servings rice x 15 g = 30 g. ? 1 serving corn x 15 g = 15 g. ? 1 serving milk x 15 g = 15 g. ? 1 serving  strawberries x 15 g = 15 g. 4. Add together all of the amounts to find the total grams of carbohydrates eaten: ? 30 g + 15 g + 15 g + 15 g = 75 g of carbohydrates total. Summary  Carbohydrate counting is a method of keeping track of how many carbohydrates you eat.  Eating carbohydrates naturally increases the amount of sugar (glucose) in the blood.  Counting how many carbohydrates you eat helps keep your blood glucose within normal limits, which helps you manage your diabetes.  A diet and nutrition specialist (registered dietitian) can help you make a meal plan and calculate how many carbohydrates you should have at each meal and snack. This information is not intended to replace advice given to you by your health care provider. Make sure you discuss any questions you have with your health care provider. Document Released: 07/06/2005 Document Revised: 01/28/2017 Document Reviewed: 12/18/2015 Elsevier Patient Education  2020 Elsevier Inc.  

## 2019-03-01 ENCOUNTER — Other Ambulatory Visit: Payer: Self-pay

## 2019-03-01 DIAGNOSIS — R7303 Prediabetes: Secondary | ICD-10-CM

## 2019-03-01 DIAGNOSIS — Z0000 Encounter for general adult medical examination without abnormal findings: Secondary | ICD-10-CM

## 2019-03-02 LAB — COMPREHENSIVE METABOLIC PANEL
ALT: 36 IU/L (ref 0–44)
AST: 23 IU/L (ref 0–40)
Albumin/Globulin Ratio: 1.8 (ref 1.2–2.2)
Albumin: 4.1 g/dL (ref 3.8–4.9)
Alkaline Phosphatase: 123 IU/L — ABNORMAL HIGH (ref 39–117)
BUN/Creatinine Ratio: 9 (ref 9–20)
BUN: 10 mg/dL (ref 6–24)
Bilirubin Total: 0.5 mg/dL (ref 0.0–1.2)
CO2: 24 mmol/L (ref 20–29)
Calcium: 9 mg/dL (ref 8.7–10.2)
Chloride: 106 mmol/L (ref 96–106)
Creatinine, Ser: 1.16 mg/dL (ref 0.76–1.27)
GFR calc Af Amer: 84 mL/min/{1.73_m2} (ref >59)
GFR calc non Af Amer: 73 mL/min/{1.73_m2} (ref >59)
Globulin, Total: 2.3 g/dL (ref 1.5–4.5)
Glucose: 138 mg/dL — ABNORMAL HIGH (ref 65–99)
Potassium: 3.8 mmol/L (ref 3.5–5.2)
Sodium: 142 mmol/L (ref 134–144)
Total Protein: 6.4 g/dL (ref 6.0–8.5)

## 2019-03-02 LAB — HEMOGLOBIN A1C
Est. average glucose Bld gHb Est-mCnc: 120 mg/dL
Hgb A1c MFr Bld: 5.8 % — ABNORMAL HIGH (ref 4.8–5.6)

## 2019-03-13 ENCOUNTER — Ambulatory Visit: Payer: Self-pay

## 2019-03-13 ENCOUNTER — Other Ambulatory Visit: Payer: Self-pay

## 2019-03-13 DIAGNOSIS — Z79899 Other long term (current) drug therapy: Secondary | ICD-10-CM | POA: Insufficient documentation

## 2019-03-13 NOTE — Progress Notes (Signed)
  [facility] Visit Note  Patient: Wayne Brown MRN: [id number] Date of Birth: Jul 12, 1966 PCP: System, Provider Not In   [name] 53 y.o. male telephoned for a MTM visit today.  There were no vitals taken for this visit.  Patient Information   Past Medical History:  Diagnosis Date  . Allergy   . Bipolar disorder ([place])   . GERD (gastroesophageal reflux disease)   . Gout   . Hypertension   . Post traumatic stress disorder   . Prediabetes       Past Surgical History:  Procedure Laterality Date  . TONSILLECTOMY  53 yrs old     Family History  Problem Relation Age of Onset  . Cancer Father 51  . Heart disease Father   . Diabetes Father   . Cancer Mother 63       Colon Cancer  . Breast cancer Neg Hx      Family Support: Good  Lifestyle  Diet: Patient eats out 2-3x/wk (fast food). Reports he has cut back on calorie intake.     Current Exercise Habits: The patient does not participate in regular exercise at present  Exercise limited by: None identified    Social History   Substance and Sexual Activity  Alcohol Use Not Currently  . Alcohol/week: 0.0 standard drinks   Comment: holidays 1 drink      Social History   Tobacco Use  Smoking Status Never Smoker  Smokeless Tobacco Never Used     Health Maintenance/Date Completed  Last ED visit: 12/17/18  Last Visit to PCP: 02/22/19 Next Visit to PCP: 05/03/19 Specialist Visit: Upcoming on 03/23/19 with neurology Prostate Exam: Refuses Colonoscopy: Refuses  Flu Vaccine: Refuses   Outpatient Encounter Medications as of 03/13/2019  Medication Sig  . ibuprofen (ADVIL) 200 MG tablet Take 600 mg by mouth at bedtime.  Marland Kitchen lisinopril (PRINIVIL,ZESTRIL) 10 MG tablet Take 1 tablet (10 mg total) by mouth daily.  Marland Kitchen omeprazole (PRILOSEC) 20 MG capsule TAKE ONE CAPSULE BY MOUTH EVERY DAY   No facility-administered encounter medications on file as of 03/13/2019.    Assessment and  Plan:  Hypertension: On lisinopril 10 mg daily. Asked patient if he checks BP at home. He says he was provided with a machine but it no longer works. Denies cough. BP appears controlled based on readings in chart. Recent creatinine stable.   GERD: On omeprazole 20 mg daily. Reports no issues with medication.   Pain: Reports taking 3 tablets of ibuprofen nightly for pain. Says he has cut back on ibuprofen intake. Counseled about water retention with this medication which can exacerbate hypertension and lower extremity edema. Upcoming visit with neurology in September.   Lower extremity edema: Patient wears compression stockings. He notices swelling is worse when he doesn't wear stockings or stands for long periods of time.   Adherence: Patient reports compliance with all medications. Not due for any refills at this time.  [place] Resident

## 2019-03-15 ENCOUNTER — Other Ambulatory Visit: Payer: Self-pay

## 2019-03-23 ENCOUNTER — Ambulatory Visit: Payer: Self-pay | Admitting: Neurology

## 2019-03-30 ENCOUNTER — Telehealth: Payer: Self-pay | Admitting: Pharmacy Technician

## 2019-03-30 NOTE — Telephone Encounter (Signed)
Received 2020 proof of income.  Patient eligible to receive medication assistance at [facility] as long as eligibility requirements continue to be met.  [facility]

## 2019-04-13 ENCOUNTER — Other Ambulatory Visit: Payer: Self-pay

## 2019-04-13 DIAGNOSIS — I10 Essential (primary) hypertension: Secondary | ICD-10-CM

## 2019-04-13 MED ORDER — LISINOPRIL 10 MG PO TABS: 10.0000 mg | ORAL_TABLET | Freq: Every day | ORAL | 0 refills | Status: DC

## 2019-04-14 ENCOUNTER — Other Ambulatory Visit: Payer: Self-pay | Admitting: Gerontology

## 2019-04-14 DIAGNOSIS — K219 Gastro-esophageal reflux disease without esophagitis: Secondary | ICD-10-CM

## 2019-04-21 ENCOUNTER — Ambulatory Visit: Payer: Self-pay

## 2019-04-26 ENCOUNTER — Other Ambulatory Visit: Payer: Self-pay

## 2019-04-27 ENCOUNTER — Other Ambulatory Visit: Payer: Self-pay

## 2019-04-27 DIAGNOSIS — Z0000 Encounter for general adult medical examination without abnormal findings: Secondary | ICD-10-CM

## 2019-04-28 LAB — CBC
Hematocrit: 38.7 % (ref 37.5–51.0)
Hemoglobin: 13.1 g/dL (ref 13.0–17.7)
MCH: 30.1 pg (ref 26.6–33.0)
MCHC: 33.9 g/dL (ref 31.5–35.7)
MCV: 89 fL (ref 79–97)
Platelets: 194 10*3/uL (ref 150–450)
RBC: 4.35 x10E6/uL (ref 4.14–5.80)
RDW: 12.8 % (ref 11.6–15.4)
WBC: 7.9 10*3/uL (ref 3.4–10.8)

## 2019-04-28 LAB — URINALYSIS
Bilirubin, UA: NEGATIVE
Glucose, UA: NEGATIVE
Ketones, UA: NEGATIVE
Leukocytes,UA: NEGATIVE
Nitrite, UA: NEGATIVE
Protein,UA: NEGATIVE
RBC, UA: NEGATIVE
Specific Gravity, UA: <=1.005 — AB (ref 1.005–1.030)
Urobilinogen, Ur: 0.2 mg/dL (ref 0.2–1.0)
pH, UA: 6 (ref 5.0–7.5)

## 2019-04-28 LAB — HEMOGLOBIN A1C
Est. average glucose Bld gHb Est-mCnc: 117 mg/dL
Hgb A1c MFr Bld: 5.7 % — ABNORMAL HIGH (ref 4.8–5.6)

## 2019-04-28 LAB — LIPID PANEL
Chol/HDL Ratio: 4.4 ratio (ref 0.0–5.0)
Cholesterol, Total: 118 mg/dL (ref 100–199)
HDL: 27 mg/dL — ABNORMAL LOW (ref >39)
LDL Chol Calc (NIH): 62 mg/dL (ref 0–99)
Triglycerides: 167 mg/dL — ABNORMAL HIGH (ref 0–149)
VLDL Cholesterol Cal: 29 mg/dL (ref 5–40)

## 2019-04-28 LAB — TSH: TSH: 1.85 u[IU]/mL (ref 0.450–4.500)

## 2019-05-02 ENCOUNTER — Other Ambulatory Visit: Payer: Self-pay

## 2019-05-02 ENCOUNTER — Encounter: Payer: Self-pay | Admitting: Neurology

## 2019-05-02 ENCOUNTER — Ambulatory Visit (INDEPENDENT_AMBULATORY_CARE_PROVIDER_SITE_OTHER): Payer: Self-pay | Admitting: Neurology

## 2019-05-02 ENCOUNTER — Other Ambulatory Visit: Payer: Self-pay | Admitting: *Deleted

## 2019-05-02 VITALS — BP 130/74 | HR 78 | Temp 97.8°F | Ht 77.0 in | Wt 259.5 lb

## 2019-05-02 DIAGNOSIS — M5412 Radiculopathy, cervical region: Secondary | ICD-10-CM | POA: Insufficient documentation

## 2019-05-02 DIAGNOSIS — M542 Cervicalgia: Secondary | ICD-10-CM | POA: Insufficient documentation

## 2019-05-02 MED ORDER — DIAZEPAM 5 MG PO TABS: ORAL_TABLET | ORAL | 0 refills | Status: DC

## 2019-05-02 MED ORDER — GABAPENTIN 300 MG PO CAPS: 300.0000 mg | ORAL_CAPSULE | Freq: Three times a day (TID) | ORAL | 11 refills | Status: DC

## 2019-05-02 NOTE — Progress Notes (Signed)
PATIENT: Wayne Brown DOB: 1966-03-12  Chief Complaint  Patient presents with  . Numbness    He is here with his wife, [name].  Reports neck pain that radiates into left shoulder, arm, hand.  He also has numbness/tingling in left upper extremity with cramping.  Marland Kitchen PCP    Iloabachie, Chioma E, NP - referring provider.  He is seen at the [facility] and does not always see the same PCP.     HISTORICAL  Wayne Brown is a 53 year old male, seen in request by his primary care nurse practitioner Caryl Asp E for evaluation of numbness, initial evaluation was on May 02, 2019.  I have reviewed and summarized the referring note from the referring physician.  He had a history of hypertension, about 20 years ago, when practicing practicing wrestling he suffered neck injury, had radiating pain from left neck to left upper extremity, symptoms usually resolved by turning his neck to extreme left, over the past 20 years, he had frequent flareups, about 5 months ago in May 2020, the day after he bending down changing tires, he noticed worsening recurrent left neck pain, radiating pain to left median arm, forearm, lateral 3 fingers, also gradually developed left hand weakness,  He presented to [facility] hospital, was evaluated, but did not have MRI cervical at that time, he denies right arm symptoms, he denies gait abnormalities.   REVIEW OF SYSTEMS: Full 14 system review of systems performed and notable only for as above All other review of systems were negative.  ALLERGIES: Allergies  Allergen Reactions  . Erythromycin Other (See Comments)    Depression per pt  . Toradol [Ketorolac Tromethamine] Nausea And Vomiting  . Tramadol Nausea And Vomiting  . Vicodin [Hydrocodone-Acetaminophen] Nausea And Vomiting  . Lithium Rash    "lithium rash" per pt  . Penicillins Rash    Per pt rxn was an infant, familial    HOME MEDICATIONS: Current Outpatient Medications   Medication Sig Dispense Refill  . lisinopril (ZESTRIL) 10 MG tablet Take 1 tablet (10 mg total) by mouth daily. 30 tablet 0  . omeprazole (PRILOSEC) 20 MG capsule TAKE ONE CAPSULE BY MOUTH EVERY DAY 90 capsule 0   No current facility-administered medications for this visit.     PAST MEDICAL HISTORY: Past Medical History:  Diagnosis Date  . Allergy   . Anxiety and depression   . Bipolar disorder ([place])   . GERD (gastroesophageal reflux disease)   . Gout   . Hypertension   . Post traumatic stress disorder   . Prediabetes     PAST SURGICAL HISTORY: Past Surgical History:  Procedure Laterality Date  . ADENOIDECTOMY    . DENTAL SURGERY    . TONSILLECTOMY  53 yrs old  . TYMPANOSTOMY TUBE PLACEMENT      FAMILY HISTORY: Family History  Problem Relation Age of Onset  . Cancer Father 15       unsure of type  . Heart disease Father   . Diabetes Father   . Colon cancer Mother 35  . Breast cancer Neg Hx     SOCIAL HISTORY: Social History   Socioeconomic History  . Marital status: Single    Spouse name: Not on file  . Number of children: 3  . Years of education: GED  . Highest education level: Not on file  Occupational History  . Occupation: Unemployed  Social Needs  . Financial resource strain: Not on file  . Food insecurity  Worry: Not on file    Inability: Not on file  . Transportation needs    Medical: Not on file    Non-medical: Not on file  Tobacco Use  . Smoking status: Never Smoker  . Smokeless tobacco: Never Used  Substance and Sexual Activity  . Alcohol use: Not Currently    Alcohol/week: 0.0 standard drinks    Comment: holidays 1 drink  . Drug use: No    Types: Marijuana    Comment:  former use at age 53   . Sexual activity: Yes    Comment: monogomus   Lifestyle  . Physical activity    Days per week: Not on file    Minutes per session: Not on file  . Stress: Not on file  Relationships  . Social Musicianconnections    Talks on phone: Not on file     Gets together: Not on file    Attends religious service: Not on file    Active member of club or organization: Not on file    Attends meetings of clubs or organizations: Not on file    Relationship status: Not on file  . Intimate partner violence    Fear of current or ex partner: Not on file    Emotionally abused: Not on file    Physically abused: Not on file    Forced sexual activity: Not on file  Other Topics Concern  . Not on file  Social History Narrative   Lives at home with his wife.   Right-handed.   Caffeine use:  4-5 drinks of sweet tea/occasional soda per day.     PHYSICAL EXAM   Vitals:   05/02/19 1401  BP: 130/74  Pulse: 78  Temp: 97.8 F (36.6 C)  Weight: 259 lb 8 oz (117.7 kg)  Height: 6\' 5"  (1.956 m)    Not recorded      Body mass index is 30.77 kg/m.  PHYSICAL EXAMNIATION:  Gen: NAD, conversant, well nourised, well groomed                     Cardiovascular: Regular rate rhythm, no peripheral edema, warm, nontender. Eyes: Conjunctivae clear without exudates or hemorrhage Neck: Supple, no carotid bruits. Pulmonary: Clear to auscultation bilaterally   NEUROLOGICAL EXAM:  MENTAL STATUS: Speech:    Speech is normal; fluent and spontaneous with normal comprehension.  Cognition:     Orientation to time, place and person     Normal recent and remote memory     Normal Attention span and concentration     Normal Language, naming, repeating,spontaneous speech     Fund of knowledge   CRANIAL NERVES: CN II: Visual fields are full to confrontation.  Pupils are round equal and briskly reactive to light. CN III, IV, VI: extraocular movement are normal. No ptosis. CN V: Facial sensation is intact to pinprick in all 3 divisions bilaterally. Corneal responses are intact.  CN VII: Face is symmetric with normal eye closure and smile. CN VIII: Hearing is normal to causal conversation. CN IX, X: Palate elevates symmetrically. Phonation is normal. CN XI: Head  turning and shoulder shrug are intact CN XII: Tongue is midline with normal movements and no atrophy.  MOTOR: Mild atrophy of left hand intrinsic muscles, he has noticed significant left proximal upper extremity weakness, left wrist extension, moderate left finger extension, abduction, grip finger flexion weakness  REFLEXES: Reflexes are decreased reflex at left triceps, biceps, symmetric at knees, and ankles. Plantar responses are  flexor.  SENSORY: Intact to light touch, pinprick, positional sensation and vibratory sensation are intact in fingers and toes.  COORDINATION: Rapid alternating movements and fine finger movements are intact. There is no dysmetria on finger-to-nose and heel-knee-shin.    GAIT/STANCE: Posture is normal. Gait is steady with normal steps, base, arm swing, and turning. Heel and toe walking are normal. Tandem gait is normal.  Romberg is absent.   DIAGNOSTIC DATA (LABS, IMAGING, TESTING) - I reviewed patient records, labs, notes, testing and imaging myself where available.   ASSESSMENT AND PLAN  NIK GORRELL is a 53 y.o. male    Left neck pain radiating pain to left upper extremity  Most consistent with left cervical radiculopathy, involving left C7, 8, T1 myotomes  Proceed with MRI of cervical spine  EMG nerve conduction study  Levert Feinstein, M.D. Ph.D.  [facility] [address], [address] [place], [place] [address] Ph: [phone] Fax: [phone]  CC: Rolm Gala, NP

## 2019-05-02 NOTE — Progress Notes (Signed)
Valium 5mg  Take 1-2 tablets 30 minutes prior to MRI, may repeat once as needed. Must have driver.

## 2019-05-03 ENCOUNTER — Telehealth: Payer: Self-pay | Admitting: Neurology

## 2019-05-03 ENCOUNTER — Ambulatory Visit (HOSPITAL_COMMUNITY)
Admission: RE | Admit: 2019-05-03 | Discharge: 2019-05-03 | Disposition: A | Discharge status: Home / Self Care | Payer: Self-pay | Source: Ambulatory Visit | Attending: Neurology | Admitting: Neurology

## 2019-05-03 ENCOUNTER — Encounter (HOSPITAL_COMMUNITY): Payer: Self-pay | Admitting: Radiology

## 2019-05-03 ENCOUNTER — Ambulatory Visit: Payer: Self-pay | Admitting: Internal Medicine

## 2019-05-03 ENCOUNTER — Encounter (INDEPENDENT_AMBULATORY_CARE_PROVIDER_SITE_OTHER): Payer: Self-pay | Admitting: Neurology

## 2019-05-03 ENCOUNTER — Other Ambulatory Visit: Payer: Self-pay

## 2019-05-03 ENCOUNTER — Ambulatory Visit (INDEPENDENT_AMBULATORY_CARE_PROVIDER_SITE_OTHER): Payer: Self-pay | Admitting: Neurology

## 2019-05-03 DIAGNOSIS — M542 Cervicalgia: Secondary | ICD-10-CM | POA: Insufficient documentation

## 2019-05-03 DIAGNOSIS — M5412 Radiculopathy, cervical region: Secondary | ICD-10-CM

## 2019-05-03 DIAGNOSIS — Z0289 Encounter for other administrative examinations: Secondary | ICD-10-CM

## 2019-05-03 NOTE — Telephone Encounter (Signed)
Please call patient, MRI of cervical spine showed cervical spondylosis, worse at C6 and 7, there is flattening of the cord, moderate to severe foraminal narrowing, worse on the left side  I will refer him to neurosurgeon for evaluation  Cervical spondylosis is worst at C6-7 where there is flattening of the cord and moderately severe to severe foraminal narrowing, worse on the left.  Mild central canal and moderate bilateral foraminal narrowing C5-6.  Broad-based central and right paracentral protrusion at C7-T1 mildly deforms the ventral cord.

## 2019-05-03 NOTE — Telephone Encounter (Signed)
Patient has [facility] assistance expires 04/06/19 to 3/17/201 patient is scheduled at [facility] for today 05/03/19 arrival time is 2:30 pm. I spoke to the patient and informed him of this and I also gave him [facility] phone number of [phone] if he had any additional questions.   FYI Dr. Krista Blue they have your cell phone number and will call you with the report.

## 2019-05-03 NOTE — Procedures (Signed)
Full Name: Wayne Brown Gender: Male MRN #: [id number] Date of Birth: 13-Oct-1965    Visit Date: 05/03/2019 09:07 Age: 53 Years 4 Months Old Examining Physician: Levert Feinstein, MD  Referring Physician: Levert Feinstein, MD History: 53 year old male, history of left-sided neck pain, radiating pain to median arm, forearm, left hand weakness.  Summary of the tests:  Nerve conduction study: Bilateral ulnar, left median sensory responses were normal.  Right median sensory response showed mildly prolonged peak latency, with normal snap amplitude. Right ulnar motor responses were normal.  Right median motor response showed moderately prolonged distal latency with mildly decreased CMAP amplitude.  Left ulnar motor responses showed mildly decreased CMAP amplitude at distal stimulation side, significant amplitude drop at proximal stimulation side.  Left median motor responses showed mildly prolonged distal latency, with mildly decreased CMAP amplitude, mild to moderate slow conduction velocity.  Electromyography: Selected needle examinations were performed at left upper extremity muscles, and left cervical paraspinal muscles.  There is evidence of active neuropathic changes involving multiple left C7, C8, T1 myotomes, mainly involve left extensor digitorum communis, left abductor digital minimum, abductor pollicis brevis.  There is also increased insertional at lower cervical paraspinal muscles, the motor unit potential and paraspinal muscles were polyphasic,    Conclusion: This is an abnormal study.  There is evidence of active left C7, 8, T1 radiculopathy.  In addition, there is also evidence of right median neuropathy across the wrist, consistent with moderate right carpal tunnel syndromes.    ------------------------------- Levert Feinstein M.D. PhD  [facility] [address] [place], [place] [address] Tel: [phone] Fax: [phone]        [facility]    Nerve / Sites  Muscle Latency Ref. Amplitude Ref. Rel Amp Segments Distance Velocity Ref. Area    ms ms mV mV %  cm m/s m/s mVms  R Median - APB     Wrist APB 5.2 ?4.4 3.8 ?4.0 100 Wrist - APB 7   14.5     Upper arm APB 9.9  4.4  114 Upper arm - Wrist 24 51 ?49 18.9  L Median - APB     Wrist APB 4.5 ?4.4 3.7 ?4.0 100 Wrist - APB 7   13.2     Upper arm APB 9.6  4.9  131 Upper arm - Wrist 22 43 ?49 16.0  R Ulnar - ADM     Wrist ADM 2.9 ?3.3 11.8 ?6.0 100 Wrist - ADM 7   30.8     B.Elbow ADM 6.8  8.2  68.9 B.Elbow - Wrist 22 57 ?49 24.6     A.Elbow ADM 8.5  7.9  96.2 A.Elbow - B.Elbow 10 56 ?49 24.3         A.Elbow - Wrist      L Ulnar - ADM     Wrist ADM 3.1 ?3.3 5.8 ?6.0 100 Wrist - ADM 7   13.7     B.Elbow ADM 7.6  1.9  32.1 B.Elbow - Wrist 21 47 ?49 4.9     A.Elbow ADM 9.8  1.6  87.9 A.Elbow - B.Elbow 10 46 ?49 4.7         A.Elbow - Wrist                 SNC    Nerve / Sites Rec. Site Peak Lat Ref.  Amp Ref. Segments Distance    ms ms V V  cm  R Median - Orthodromic (Dig  II, Mid palm)     Dig II Wrist 3.6 ?3.4 14 ?10 Dig II - Wrist 13  L Median - Orthodromic (Dig II, Mid palm)     Dig II Wrist 3.1 ?3.4 11 ?10 Dig II - Wrist 13  R Ulnar - Orthodromic, (Dig V, Mid palm)     Dig V Wrist 2.6 ?3.1 7 ?5 Dig V - Wrist 11  L Ulnar - Orthodromic, (Dig V, Mid palm)     Dig V Wrist 2.5 ?3.1 6 ?5 Dig V - Wrist 52              F  Wave    Nerve F Lat Ref.   ms ms  R Ulnar - ADM 31.1 ?32.0  L Ulnar - ADM 35.4 ?32.0         EMG       EMG Summary Table    Spontaneous MUAP Recruitment  Muscle IA Fib PSW Fasc Other Amp Dur. Poly Pattern  L. Abductor pollicis brevis Increased 1+ None None _______ Normal Normal Normal Reduced  L. Abductor digiti minimi (manus) Increased 2+ None None _______ Normal Increased 1+ Reduced  L. Pronator teres Normal None None None _______ Normal Normal Normal Reduced  L. Extensor digitorum communis Increased 2+ 2+ None _______ Increased Increased 2+ Reduced  L.  Brachioradialis Increased None None None _______ Normal Normal Normal Reduced  L. Biceps brachii Normal None None None _______ Normal Normal Normal Reduced  L. Deltoid Normal None None None _______ Normal Normal Normal Reduced  L. Triceps brachii Normal None None None _______ Normal Normal Normal Reduced  L. Cervical paraspinals Normal None None None _______ Normal Normal Normal Normal

## 2019-05-03 NOTE — Telephone Encounter (Signed)
I spoke to the patient and he verbalized understanding of his results.  He is agreeable to the neurosurgery referral and is aware to expect a call for scheduling.

## 2019-05-04 NOTE — Telephone Encounter (Signed)
Pt called in wanting to know if his mri results can be mailed to him

## 2019-05-04 NOTE — Telephone Encounter (Signed)
Noted Referral sent 05/03/2019

## 2019-05-10 ENCOUNTER — Ambulatory Visit: Payer: Self-pay | Admitting: Internal Medicine

## 2019-05-10 ENCOUNTER — Other Ambulatory Visit: Payer: Self-pay

## 2019-05-10 DIAGNOSIS — K219 Gastro-esophageal reflux disease without esophagitis: Secondary | ICD-10-CM

## 2019-05-10 DIAGNOSIS — I10 Essential (primary) hypertension: Secondary | ICD-10-CM

## 2019-05-10 DIAGNOSIS — R748 Abnormal levels of other serum enzymes: Secondary | ICD-10-CM

## 2019-05-10 DIAGNOSIS — R600 Localized edema: Secondary | ICD-10-CM

## 2019-05-10 DIAGNOSIS — M5412 Radiculopathy, cervical region: Secondary | ICD-10-CM

## 2019-05-10 NOTE — Progress Notes (Signed)
Established Patient Office Visit  Subjective:  Patient ID: Wayne Brown, male    DOB: 1965-09-21  Age: 53 y.o. MRN: [id number]  CC:  Chief Complaint  Patient presents with  . Hypertension    HPI Wayne Brown is a 53 year old presenting for a telephonic visit. Pt shares that neurologist has stated that full use of his arm is not likely. Pt will begin physical therapy to increase mobility. Pt reports he has a BP cuff and checks BP regularly.   Past Medical History:  Diagnosis Date  . Allergy   . Anxiety and depression   . Bipolar disorder ([place])   . GERD (gastroesophageal reflux disease)   . Gout   . Hypertension   . Post traumatic stress disorder   . Prediabetes     Past Surgical History:  Procedure Laterality Date  . ADENOIDECTOMY    . DENTAL SURGERY    . TONSILLECTOMY  53 yrs old  . TYMPANOSTOMY TUBE PLACEMENT      Family History  Problem Relation Age of Onset  . Cancer Father 54       unsure of type  . Heart disease Father   . Diabetes Father   . Colon cancer Mother 5  . Breast cancer Neg Hx     Social History   Socioeconomic History  . Marital status: Single    Spouse name: Not on file  . Number of children: 3  . Years of education: GED  . Highest education level: Not on file  Occupational History  . Occupation: Unemployed  Social Needs  . Financial resource strain: Not on file  . Food insecurity    Worry: Not on file    Inability: Not on file  . Transportation needs    Medical: Not on file    Non-medical: Not on file  Tobacco Use  . Smoking status: Never Smoker  . Smokeless tobacco: Never Used  Substance and Sexual Activity  . Alcohol use: Not Currently    Alcohol/week: 0.0 standard drinks    Comment: holidays 1 drink  . Drug use: No    Types: Marijuana    Comment:  former use at age 40   . Sexual activity: Yes    Comment: monogomus   Lifestyle  . Physical activity    Days per week: Not on file    Minutes per session: Not  on file  . Stress: Not on file  Relationships  . Social Herbalist on phone: Not on file    Gets together: Not on file    Attends religious service: Not on file    Active member of club or organization: Not on file    Attends meetings of clubs or organizations: Not on file    Relationship status: Not on file  . Intimate partner violence    Fear of current or ex partner: Not on file    Emotionally abused: Not on file    Physically abused: Not on file    Forced sexual activity: Not on file  Other Topics Concern  . Not on file  Social History Narrative   Lives at home with his wife.   Right-handed.   Caffeine use:  4-5 drinks of sweet tea/occasional soda per day.    Outpatient Medications Prior to Visit  Medication Sig Dispense Refill  . diazepam (VALIUM) 5 MG tablet Take 1-2 tablets 30 minutes prior to MRI, may repeat once as needed. Must have driver.  3 tablet 0  . gabapentin (NEURONTIN) 300 MG capsule Take 1 capsule (300 mg total) by mouth 3 (three) times daily. 90 capsule 11  . lisinopril (ZESTRIL) 10 MG tablet Take 1 tablet (10 mg total) by mouth daily. 30 tablet 0  . omeprazole (PRILOSEC) 20 MG capsule TAKE ONE CAPSULE BY MOUTH EVERY DAY 90 capsule 0   No facility-administered medications prior to visit.     Allergies  Allergen Reactions  . Erythromycin Other (See Comments)    Depression per pt  . Toradol [Ketorolac Tromethamine] Nausea And Vomiting  . Tramadol Nausea And Vomiting  . Vicodin [Hydrocodone-Acetaminophen] Nausea And Vomiting  . Lithium Rash    "lithium rash" per pt  . Penicillins Rash    Per pt rxn was an infant, familial    ROS Review of Systems    Objective:    Physical Exam   No PE - telephonic visit  There were no vitals taken for this visit. Wt Readings from Last 3 Encounters:  05/02/19 259 lb 8 oz (117.7 kg)  02/14/19 271 lb 3.2 oz (123 kg)  12/17/18 262 lb (118.8 kg)     Health Maintenance Due  Topic Date Due  . HIV  Screening  12/03/1982  . TETANUS/TDAP  12/03/1986  . COLONOSCOPY  12/02/2017  . INFLUENZA VACCINE  02/18/2019    There are no preventive care reminders to display for this patient.  Lab Results  Component Value Date   TSH 1.850 04/27/2019   Lab Results  Component Value Date   WBC 7.9 04/27/2019   HGB 13.1 04/27/2019   HCT 38.7 04/27/2019   MCV 89 04/27/2019   PLT 194 04/27/2019   Lab Results  Component Value Date   NA 142 03/01/2019   K 3.8 03/01/2019   CO2 24 03/01/2019   GLUCOSE 138 (H) 03/01/2019   BUN 10 03/01/2019   CREATININE 1.16 03/01/2019   BILITOT 0.5 03/01/2019   ALKPHOS 123 (H) 03/01/2019   AST 23 03/01/2019   ALT 36 03/01/2019   PROT 6.4 03/01/2019   ALBUMIN 4.1 03/01/2019   CALCIUM 9.0 03/01/2019   ANIONGAP 6 (L) 10/06/2013   Lab Results  Component Value Date   CHOL 118 04/27/2019   Lab Results  Component Value Date   HDL 27 (L) 04/27/2019   Lab Results  Component Value Date   LDLCALC 62 04/27/2019   Lab Results  Component Value Date   TRIG 167 (H) 04/27/2019   Lab Results  Component Value Date   CHOLHDL 4.4 04/27/2019   Lab Results  Component Value Date   HGBA1C 5.7 (H) 04/27/2019      Assessment & Plan:   Problem List Items Addressed This Visit      Cardiovascular and Mediastinum   Hypertension - Primary   Relevant Orders   CBC   Comp Met (CMET)   UA/M w/rflx Culture, Routine   Lipid Profile   TSH     Digestive   GERD (gastroesophageal reflux disease)     Nervous and Auditory   Left cervical radiculopathy     Other   Alkaline phosphatase elevation   Bilateral lower extremity edema     1. Essential hypertension BP readings by recent medical offices have been within normal limits. Was given a BP cuff by out clinic a few weeks ago. Pt instructed to check BP at least once a month. - CBC; Future - Comp Met (CMET); Future - UA/M w/rflx Culture, Routine; Future - Lipid Profile;  Future - TSH; Future  2.  Gastroesophageal reflux disease, unspecified whether esophagitis present Pt has been stable with current medication. Will continue for additional year. Waiting on Medical Management to request refills.  3. Alkaline phosphatase elevation Mild elevation persits, etiology not apparent. Will continue to FU.  4. Left cervical radiculopathy Nuerology and recent neurosurgery visit for cervical disk syndrome under active management. Today's neruosurgery evaluation decided that surgery was not needed at this time and pt is being referred to physical therapy.   5. Bilateral lower extremity edema Bilateral edema continues to be unexplained but has some cyclic unexplained. Some days pt has minimal to no edema, other days he cannot put his shoes on easily. Work up has been uneventful for finding a cause. 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 Advised to concider changing pain medication to acetaminophen and to reduce sodium intake to his diet, such as potato chips, peanuts, etc.  FU in a year as needed with labs a week prior.    No orders of the defined types were placed in this encounter.   Follow-up: Return in about 1 year (around 05/09/2020).    Alveena Taira P Paulita Licklider

## 2019-05-16 ENCOUNTER — Other Ambulatory Visit: Payer: Self-pay | Admitting: Gerontology

## 2019-05-16 DIAGNOSIS — K219 Gastro-esophageal reflux disease without esophagitis: Secondary | ICD-10-CM

## 2019-06-01 ENCOUNTER — Ambulatory Visit: Payer: Self-pay | Admitting: Physical Therapy

## 2019-06-02 ENCOUNTER — Other Ambulatory Visit: Payer: Self-pay | Admitting: Internal Medicine

## 2019-06-02 DIAGNOSIS — I10 Essential (primary) hypertension: Secondary | ICD-10-CM

## 2019-06-06 ENCOUNTER — Encounter: Payer: Self-pay | Admitting: Physical Therapy

## 2019-06-08 ENCOUNTER — Encounter: Payer: Self-pay | Admitting: Physical Therapy

## 2019-06-19 ENCOUNTER — Ambulatory Visit: Payer: Self-pay | Admitting: Physical Therapy

## 2019-06-20 ENCOUNTER — Encounter: Payer: Self-pay | Admitting: Physical Therapy

## 2019-06-21 ENCOUNTER — Encounter: Payer: Self-pay | Admitting: Physical Therapy

## 2019-06-22 ENCOUNTER — Encounter: Payer: Self-pay | Admitting: Physical Therapy

## 2019-06-26 ENCOUNTER — Encounter: Payer: Self-pay | Admitting: Physical Therapy

## 2019-06-28 ENCOUNTER — Encounter: Payer: Self-pay | Admitting: Physical Therapy

## 2019-06-29 ENCOUNTER — Encounter: Payer: Self-pay | Admitting: Physical Therapy

## 2019-07-03 ENCOUNTER — Encounter: Payer: Self-pay | Admitting: Physical Therapy

## 2019-07-04 ENCOUNTER — Ambulatory Visit: Payer: Self-pay | Attending: Neurosurgery | Admitting: Physical Therapy

## 2019-07-04 ENCOUNTER — Encounter: Payer: Self-pay | Admitting: Physical Therapy

## 2019-07-04 ENCOUNTER — Other Ambulatory Visit: Payer: Self-pay

## 2019-07-04 DIAGNOSIS — M5412 Radiculopathy, cervical region: Secondary | ICD-10-CM | POA: Insufficient documentation

## 2019-07-04 DIAGNOSIS — M542 Cervicalgia: Secondary | ICD-10-CM | POA: Insufficient documentation

## 2019-07-04 NOTE — Therapy (Signed)
[place] [facility] [address], [place], [address] Phone: [phone]   Fax:  [phone]  Physical Therapy Evaluation  Patient Details  Name: Wayne Brown MRN: [id number] Date of Birth: [birth year] No data recorded  Encounter Date: 07/04/2019  PT End of Session - 07/04/19 1038    Visit Number  1    Number of Visits  17    Date for PT Re-Evaluation  08/29/19    PT Start Time  1015    PT Stop Time  1115    PT Time Calculation (min)  60 min    Activity Tolerance  Patient tolerated treatment well    Behavior During Therapy  [facility] for tasks assessed/performed       Past Medical History:  Diagnosis Date  . Allergy   . Anxiety and depression   . Bipolar disorder (HCC)   . GERD (gastroesophageal reflux disease)   . Gout   . Hypertension   . Post traumatic stress disorder   . Prediabetes     Past Surgical History:  Procedure Laterality Date  . ADENOIDECTOMY    . DENTAL SURGERY    . TONSILLECTOMY  53 yrs old  . TYMPANOSTOMY TUBE PLACEMENT      There were no vitals filed for this visit.     OBJECTIVE  Mental Status Patient is oriented to person, place and time.  Recent memory is intact.  Remote memory is intact.  Attention span and concentration are intact.  Expressive speech is intact.  Patient's fund of knowledge is within normal limits for educational level.  SENSATION: Grossly intact to light touch bilateral UE as determined by testing dermatomes C2-T2 Proprioception and hot/cold testing deferred on this date   MUSCULOSKELETAL: Tremor: None Bulk: Normal Tone: Normal   Posture severe forward head posture, rounded shoulders   Palpation No TTP at periscapular or cervical musculature  Strength R/L 5/5 Shoulder flexion (anterior deltoid/pec major/coracobrachialis, axillary n. (C5/6) and musculocutaneous n. (C5-7)) 5/5 Shoulder abduction (deltoid/supraspinatus, axillary/suprascapular  n, C5) 5/5 Shoulder external rotation (infraspinatus/teres minor) 5/5 Shoulder internal rotation (subcapularis/lats/pec major) 5/5 Shoulder extension (posterior deltoid, lats, teres major, axillary/thoracodorsal n.) 5/5 Elbow flexion (biceps brachii, brachialis, brachioradialis, musculoskeletal n, C5/6) 5/5 Elbow extension (triceps, radial n, C7) 5/5 Wrist Extension (C6/7) 5/5 Wrist Flexion (C6/7) 5/2+ Finger adduction (interossei, ulnar n, T1) 5/4+ Digit flexion (all, same at MTP, PIP, DIP) 5/1 Digit ext (all, same at MTP, PIP, DIP) Pollex  5/5 Flexion   5/1 Extension  5/1 Abduction  5/1 Adduction 110#/ 60# Grip Strength Cervical isometrics are strong in all directions with some discomfort with retraction  AROM R/L 50 Cervical Flexion 65 Cervical Extension 45/45 Cervical Lateral Flexion 70/70 Cervical Rotation *Indicates pain, overpressure performed unless otherwise indicated  PROM All passive motions WNL  Repeated Movements No centralization or peripheralization of symptoms with repeated cervical protraction and retraction.    Passive Accessory Intervertebral Motion (PAIVM) Pt denies reproduction of neck pain with CPA C2-T7 and UPA bilaterally C2-T7. Generally hypomobile throughout  Passive Physiological Intervertebral Motion (PPIVM) Normal flexion and extension with PPIVM testing  Reflex Testing Biceps (C5/6): decreased bilat Brachioradialis (C5/6): decreased bilat Triceps (C7): decreased bilat  SPECIAL TESTS Spurlings A (ipsilateral lateral flexion/axial compression): Negative Spurlings B (ipsilateral lateral flexion/contralateral rotation/axial compression): Positive Distraction Test: Negative, difficulty to fully assess d/t gaurding Hoffman Sign (cervical cord compression): Negative bilat ULTT Median:Negative bilat ULTT Ulnar: Negative bilat ULTT Radial: Negative bilat  Ther-Ex Cervical retraction  with UE for cuing with good carry over following demo x10  3sec hold Education with printout given to complete x10 every hour Education on posture and condition with verbalized understanding                           Objective measurements completed on examination: See above findings.              PT Education - 07/04/19 1038    Education Details  Patient was educated on diagnosis, anatomy and pathology involved, prognosis, role of PT, and was given an HEP, demonstrating exercise with proper form following verbal and tactile cues, and was given a paper hand out to continue exercise at home. Pt was educated on and agreed to plan of care.    Person(s) Educated  Patient    Methods  Explanation;Demonstration;Tactile cues;Verbal cues;Handout    Comprehension  Verbalized understanding;Returned demonstration;Verbal cues required;Tactile cues required       PT Short Term Goals - 07/04/19 1233      PT SHORT TERM GOAL #1   Title  Pt will be independent with HEP in order to improve strength and decrease back pain in order to improve pain-free function at home and work.    Baseline  07/04/19    Time  4    Period  Weeks    Status  New        PT Long Term Goals - 07/04/19 1234      PT LONG TERM GOAL #1   Title  Pt will decrease quick DASH score by at least 8% in order to demonstrate clinically significant reduction in disability.    Baseline  07/04/19 22.7%    Time  8    Period  Weeks    Status  New      PT LONG TERM GOAL #2   Title  Pt will decrease worst neck pain as reported on NPRS by at least 2 points in order to demonstrate clinically significant reduction in back pain.    Baseline  07/04/19 8/10    Time  4    Period  Weeks    Status  New      PT LONG TERM GOAL #3   Title  Patient will demonstrate L hand grip strength of 110# in order to demonstrate symmetrical grip strenght needed to complete heavy household chores    Baseline  07/04/19 60#    Time  8    Period  Weeks    Status  New      PT LONG  TERM GOAL #4   Title  Pt will demonstrate 5/5 L MTP ext of all digits and gross 5/5 L pollex strength in order to demonstrate symmetry to R strength, and to safely operate his motorcycle    Baseline  07/04/19 R/L MTP Digit ext (all) 5/1; Pollex flex 5/5 ext 5/1 Abd 5/1 Adduction 5/1    Time  8    Period  Weeks    Status  New             Plan - 07/04/19 1251    Clinical Impression Statement  Pt is a 53 year old male presenting with MRI diagnosed cervical rediculopathy, with associated L hand sensation and motor deficits. Impairments in L hand sensation, L digit motion, grip and digit strength, posture deficits and pain. Activity limitations in lifting, pushing, pulling, and gripping, inhibiting ability to work in [place], and his hobbies  of riding his motorcycle and wrestling. Pt denies N/V, B&B changes, unexplained weight fluctuation, saddle paresthesia, fever, night sweats, or unrelenting night pain at this time.    Personal Factors and Comorbidities  Age;Comorbidity 1;Comorbidity 2;Past/Current Experience;Fitness    Comorbidities  HTN, PTSD    Examination-Activity Limitations  Lift;Carry;Reach Overhead;Sleep    Examination-Participation Restrictions  Cleaning;Community Activity;Driving    Stability/Clinical Decision Making  Evolving/Moderate complexity    Clinical Decision Making  Moderate    Rehab Potential  Good    PT Frequency  2x / week    PT Duration  8 weeks    PT Treatment/Interventions  Moist Heat;Traction;Therapeutic exercise;Patient/family education;Manual techniques;Passive range of motion;Electrical Stimulation;Cryotherapy;ADLs/Self Care Home Management;Ultrasound;Dry needling;Functional mobility training;Neuromuscular re-education;Therapeutic activities;DME Instruction;Iontophoresis 4mg /ml Dexamethasone;Taping;Joint Manipulations;Spinal Manipulations    PT Next Visit Plan  postural training    PT Home Exercise Plan  cervical retraction    Consulted and Agree with Plan of  Care  Patient       Patient will benefit from skilled therapeutic intervention in order to improve the following deficits and impairments:  Pain, Postural dysfunction, Impaired UE functional use, Increased fascial restricitons, Decreased strength, Decreased coordination, Decreased range of motion, Improper body mechanics, Decreased endurance, Decreased mobility, Impaired sensation  Visit Diagnosis: Left cervical radiculopathy  Cervicalgia     Problem List Patient Active Problem List   Diagnosis Date Noted  . Left cervical radiculopathy 05/02/2019  . Cervicalgia 05/02/2019  . Encounter for medication management 03/13/2019  . Paresthesia of left arm 02/14/2019  . Bilateral lower extremity edema 02/14/2019  . Fatigue 02/14/2019  . Shortness of breath 02/14/2019  . Prediabetes 02/14/2019  . Hypertension 07/23/2016  . Restless leg syndrome 05/28/2016  . Foot pain, left 05/28/2016  . Ear pain, right 05/28/2016  . Breast pain, left 12/12/2015  . Shoulder pain, right 12/12/2015  . Alkaline phosphatase elevation 11/14/2015  . Atypical chest pain 11/14/2015  . PTSD (post-traumatic stress disorder) 11/14/2015  . Dysphagia 11/14/2015  . Abnormal weight gain 11/14/2015  . Acute gout 11/14/2015  . GERD (gastroesophageal reflux disease) 09/05/2015  . Pain and swelling of wrist 09/05/2015   Staci Acostahelsea Miller PT, DPT Staci Acostahelsea Miller 07/04/2019, 1:02 PM  [place] [facility] [address] [place], [place], [address] Phone: [phone]   Fax:  [phone]  Name: Wayne Brown MRN: [id number] Date of Birth: June 03, 1966

## 2019-07-05 ENCOUNTER — Encounter: Payer: Self-pay | Admitting: Physical Therapy

## 2019-07-06 ENCOUNTER — Encounter: Payer: Self-pay | Admitting: Physical Therapy

## 2019-07-11 ENCOUNTER — Ambulatory Visit: Payer: Self-pay | Admitting: Physical Therapy

## 2019-07-12 ENCOUNTER — Other Ambulatory Visit: Payer: Self-pay | Admitting: Internal Medicine

## 2019-07-12 DIAGNOSIS — K219 Gastro-esophageal reflux disease without esophagitis: Secondary | ICD-10-CM

## 2019-07-17 ENCOUNTER — Ambulatory Visit: Payer: Self-pay | Admitting: Physical Therapy

## 2019-07-19 ENCOUNTER — Ambulatory Visit: Payer: Self-pay | Admitting: Physical Therapy

## 2019-07-24 ENCOUNTER — Ambulatory Visit: Payer: Self-pay | Attending: Neurosurgery | Admitting: Physical Therapy

## 2019-07-26 ENCOUNTER — Encounter: Payer: Self-pay | Admitting: Physical Therapy

## 2019-07-31 ENCOUNTER — Encounter: Payer: Self-pay | Admitting: Physical Therapy

## 2019-08-01 ENCOUNTER — Other Ambulatory Visit: Payer: Self-pay | Admitting: Internal Medicine

## 2019-08-01 DIAGNOSIS — I10 Essential (primary) hypertension: Secondary | ICD-10-CM

## 2019-08-03 ENCOUNTER — Encounter: Payer: Self-pay | Admitting: Physical Therapy

## 2019-08-07 ENCOUNTER — Encounter: Payer: Self-pay | Admitting: Physical Therapy

## 2019-08-09 ENCOUNTER — Encounter: Payer: Self-pay | Admitting: Physical Therapy

## 2019-08-14 ENCOUNTER — Encounter: Payer: Self-pay | Admitting: Physical Therapy

## 2019-08-16 ENCOUNTER — Encounter: Payer: Self-pay | Admitting: Physical Therapy

## 2019-09-13 ENCOUNTER — Other Ambulatory Visit: Payer: Self-pay | Admitting: Internal Medicine

## 2019-09-13 DIAGNOSIS — K219 Gastro-esophageal reflux disease without esophagitis: Secondary | ICD-10-CM

## 2019-11-30 ENCOUNTER — Other Ambulatory Visit: Payer: Self-pay

## 2019-11-30 DIAGNOSIS — K219 Gastro-esophageal reflux disease without esophagitis: Secondary | ICD-10-CM

## 2019-11-30 DIAGNOSIS — I10 Essential (primary) hypertension: Secondary | ICD-10-CM

## 2019-11-30 MED ORDER — OMEPRAZOLE 20 MG PO CPDR: 20.0000 mg | DELAYED_RELEASE_CAPSULE | Freq: Every day | ORAL | 0 refills | Status: DC

## 2019-11-30 MED ORDER — LISINOPRIL 10 MG PO TABS: 10.0000 mg | ORAL_TABLET | Freq: Every day | ORAL | 0 refills | Status: DC

## 2019-12-19 ENCOUNTER — Ambulatory Visit: Payer: Self-pay

## 2020-01-03 ENCOUNTER — Telehealth: Payer: Self-pay | Admitting: Pharmacy Technician

## 2020-01-03 NOTE — Telephone Encounter (Signed)
Received updated proof of income.  Patient eligible to receive medication assistance at [facility] until time for re-certification in 9359, and as long as eligibility requirements continue to be met.  [place] [facility]

## 2020-01-15 ENCOUNTER — Other Ambulatory Visit: Payer: Self-pay | Admitting: Internal Medicine

## 2020-01-15 DIAGNOSIS — K219 Gastro-esophageal reflux disease without esophagitis: Secondary | ICD-10-CM

## 2020-01-29 ENCOUNTER — Other Ambulatory Visit: Payer: Self-pay

## 2020-01-29 ENCOUNTER — Emergency Department
Admission: EM | Admit: 2020-01-29 | Discharge: 2020-01-29 | Disposition: A | Discharge status: Home / Self Care | Payer: Self-pay | Attending: Emergency Medicine | Admitting: Emergency Medicine

## 2020-01-29 DIAGNOSIS — I10 Essential (primary) hypertension: Secondary | ICD-10-CM | POA: Insufficient documentation

## 2020-01-29 DIAGNOSIS — Z79899 Other long term (current) drug therapy: Secondary | ICD-10-CM | POA: Insufficient documentation

## 2020-01-29 DIAGNOSIS — M7711 Lateral epicondylitis, right elbow: Secondary | ICD-10-CM | POA: Insufficient documentation

## 2020-01-29 MED ORDER — PREDNISONE 10 MG (21) PO TBPK: ORAL_TABLET | ORAL | 0 refills | Status: DC

## 2020-01-29 NOTE — ED Triage Notes (Signed)
Pt states he injured his right arm around the elbow about a month ago and is still having pain,

## 2020-01-29 NOTE — ED Notes (Signed)
Pt states sharp shooting pain to right arm. Pt states he is able to bend it but it is painful.

## 2020-01-29 NOTE — Discharge Instructions (Addendum)
For an over-the-counter tennis elbow brace.  Wear this for 1 to 2 weeks. Return to the emergency department if worsening.  Follow-up with orthopedics if not better in 1 week.  Take the Sterapred as prescribed.  Do not take your ibuprofen while taking Sterapred.  This too much medication for your kidneys.  Apply ice

## 2020-01-29 NOTE — ED Provider Notes (Signed)
[facility] Emergency Department Provider Note  ____________________________________________   First MD Initiated Contact with Patient 01/29/20 1348     (approximate)  I have reviewed the triage vital signs and the nursing notes.   HISTORY  Chief Complaint Arm Pain    HPI Wayne Brown is a 54 y.o. male presents emergency department complaint right arm pain for 1 to 2 months.  States today he reached for a container of milk and dropped it due to the pain in the elbow.  He denies any numbness or tingling.  No known injury.    Past Medical History:  Diagnosis Date   Allergy    Anxiety and depression    Bipolar disorder (HCC)    GERD (gastroesophageal reflux disease)    Gout    Hypertension    Post traumatic stress disorder    Prediabetes     Patient Active Problem List   Diagnosis Date Noted   Left cervical radiculopathy 05/02/2019   Cervicalgia 05/02/2019   Encounter for medication management 03/13/2019   Paresthesia of left arm 02/14/2019   Bilateral lower extremity edema 02/14/2019   Fatigue 02/14/2019   Shortness of breath 02/14/2019   Prediabetes 02/14/2019   Hypertension 07/23/2016   Restless leg syndrome 05/28/2016   Foot pain, left 05/28/2016   Ear pain, right 05/28/2016   Breast pain, left 12/12/2015   Shoulder pain, right 12/12/2015   Alkaline phosphatase elevation 11/14/2015   Atypical chest pain 11/14/2015   PTSD (post-traumatic stress disorder) 11/14/2015   Dysphagia 11/14/2015   Abnormal weight gain 11/14/2015   Acute gout 11/14/2015   GERD (gastroesophageal reflux disease) 09/05/2015   Pain and swelling of wrist 09/05/2015    Past Surgical History:  Procedure Laterality Date   ADENOIDECTOMY     DENTAL SURGERY     TONSILLECTOMY  54 yrs old   TYMPANOSTOMY TUBE PLACEMENT      Prior to Admission medications   Medication Sig Start Date End Date Taking? Authorizing Provider    lisinopril (ZESTRIL) 10 MG tablet Take 1 tablet (10 mg total) by mouth daily. 11/30/19   Virl Axe, MD  omeprazole (PRILOSEC) 20 MG capsule TAKE ONE CAPSULE BY MOUTH EVERY DAY 01/16/20   Virl Axe, MD  predniSONE (STERAPRED UNI-PAK 21 TAB) 10 MG (21) TBPK tablet Take 6 pills on day one then decrease by 1 pill each day 01/29/20   [name], PA-C  gabapentin (NEURONTIN) 300 MG capsule Take 1 capsule (300 mg total) by mouth 3 (three) times daily. Patient not taking: Reported on 07/04/2019 05/02/19 01/29/20  Levert Feinstein, MD    Allergies Erythromycin, Toradol [ketorolac tromethamine], Tramadol, Vicodin [hydrocodone-acetaminophen], Lithium, and Penicillins  Family History  Problem Relation Age of Onset   Cancer Father 87       unsure of type   Heart disease Father    Diabetes Father    Colon cancer Mother 28   Breast cancer Neg Hx     Social History Social History   Tobacco Use   Smoking status: Never Smoker   Smokeless tobacco: Never Used  Building services engineer Use: Never used  Substance Use Topics   Alcohol use: Not Currently    Alcohol/week: 0.0 standard drinks    Comment: holidays 1 drink   Drug use: No    Types: Marijuana    Comment:  former use at age 66     Review of Systems  Constitutional: No fever/chills Eyes: No  visual changes. ENT: No sore throat. Respiratory: Denies cough Genitourinary: Negative for dysuria. Musculoskeletal: Negative for back pain.  Positive for lumbar pain Skin: Negative for rash. Psychiatric: no mood changes,     ____________________________________________   PHYSICAL EXAM:  VITAL SIGNS: ED Triage Vitals [01/29/20 1330]  Enc Vitals Group     BP (!) 147/74     Pulse Rate 72     Resp 16     Temp 99 F (37.2 C)     Temp Source Oral     SpO2 98 %     Weight 265 lb (120.2 kg)     Height 6\' 5"  (1.956 m)     Head Circumference      Peak Flow      Pain Score 6     Pain Loc      Pain Edu?      Excl. in GC?      Constitutional: Alert and oriented. Well appearing and in no acute distress. Eyes: Conjunctivae are normal.  Head: Atraumatic. Nose: No congestion/rhinnorhea. Mouth/Throat: Mucous membranes are moist.   Neck:  supple no lymphadenopathy noted Cardiovascular: Normal rate, regular rhythm. Respiratory: Normal respiratory effort.  No retractions, GU: deferred Musculoskeletal: FROM all extremities, warm and well perfused, lateral epicondyle area is tender, pain is reproduced with movement, neurovascular intact, no bony tenderness is appreciated Neurologic:  Normal speech and language.  Skin:  Skin is warm, dry and intact. No rash noted. Psychiatric: Mood and affect are normal. Speech and behavior are normal.  ____________________________________________   LABS (all labs ordered are listed, but only abnormal results are displayed)  Labs Reviewed - No data to display ____________________________________________   ____________________________________________  RADIOLOGY    ____________________________________________   PROCEDURES  Procedure(s) performed: No  Procedures    ____________________________________________   INITIAL IMPRESSION / ASSESSMENT AND PLAN / ED COURSE  Pertinent labs & imaging results that were available during my care of the patient were reviewed by me and considered in my medical decision making (see chart for details).   Patient is 54 year old male presents emergency department with concerns of right elbow pain.  See HPI  Physical exam shows patient to appear well.  Right elbow is tender at the epicondyle.  Remainder of exam is unremarkable  Did explain findings to the patient.  Told he has a tendinitis in the area.  Should buy over-the-counter tennis elbow brace.  Is given a prescription for steroid pack.  Follow-up with orthopedics if not improving in 2 to 3 days.  Is discharged stable condition.      As part of my medical decision making, I  reviewed the following data within the electronic MEDICAL RECORD NUMBER Nursing notes reviewed and incorporated, Old chart reviewed, Notes from prior ED visits and [place] Controlled Substance Database  ____________________________________________   FINAL CLINICAL IMPRESSION(S) / ED DIAGNOSES  Final diagnoses:  Lateral epicondylitis of right elbow      NEW MEDICATIONS STARTED DURING THIS VISIT:  New Prescriptions   PREDNISONE (STERAPRED UNI-PAK 21 TAB) 10 MG (21) TBPK TABLET    Take 6 pills on day one then decrease by 1 pill each day     Note:  This document was prepared using Dragon voice recognition software and may include unintentional dictation errors.    44, PA-C 01/29/20 1428    03/31/20, MD 01/29/20 [phone]

## 2020-03-11 ENCOUNTER — Other Ambulatory Visit: Payer: Self-pay | Admitting: Pharmacist

## 2020-03-26 ENCOUNTER — Ambulatory Visit: Payer: Self-pay | Admitting: Pharmacist

## 2020-03-26 DIAGNOSIS — Z79899 Other long term (current) drug therapy: Secondary | ICD-10-CM

## 2020-03-26 NOTE — Progress Notes (Signed)
  [facility] Visit Note  Patient: Wayne Brown MRN: [id number] Date of Birth: April 03, 1966 PCP: Rolm Gala, NP   [name] 54 y.o. male presents for a MTM.  There were no vitals taken for this visit.  Patient Information   Past Medical History:  Diagnosis Date  . Allergy   . Anxiety and depression   . Bipolar disorder (HCC)   . GERD (gastroesophageal reflux disease)   . Gout   . Hypertension   . Post traumatic stress disorder   . Prediabetes       Past Surgical History:  Procedure Laterality Date  . ADENOIDECTOMY    . DENTAL SURGERY    . TONSILLECTOMY  54 yrs old  . TYMPANOSTOMY TUBE PLACEMENT       Family History  Problem Relation Age of Onset  . Cancer Father 45       unsure of type  . Heart disease Father   . Diabetes Father   . Colon cancer Mother 23  . Breast cancer Neg Hx     Family Support:  Brother/mom/sister help some.  Eldest son lives with him.    Patient currently going through a divorce.   Social History   Substance and Sexual Activity  Alcohol Use Not Currently  . Alcohol/week: 0.0 standard drinks   Comment: holidays 1 drink      Social History   Tobacco Use  Smoking Status Never Smoker  Smokeless Tobacco Never Used      Health Maintenance  Topic Date Due  . Hepatitis C Screening  Never done  . COVID-19 Vaccine (1) Never done  . HIV Screening  Never done  . TETANUS/TDAP  Never done  . COLONOSCOPY  Never done  . INFLUENZA VACCINE  Never done     Assessment and Plan:  Last ED visit:  ~1 year  Last Visit to PCP:  Last visit over phone.  Awhile ago.   Next Visit to PCP:  Not yet.  Specialist Visit:  No  Dental Exam:  Not sure, dentures  Eye Exam:  ~1 year  Prostate Exam:  Never  Pelvic/PAP Exam:  NA Mammogram:  NA  DEXA:  NA  Colonoscopy:  Not yet  Flu Vaccine:  No  COVID Vaccine:  No   Hypertension:  Goal <130/80.  Patient mentions that he gets a headache when blood pressure  increases.  Encouraged to keep a log and check BP once weekly.  He is unsure of typical blood pressure values.  Patient mentions sinus-related cough, but has had this for years.  Patient denies orthostasis.  Also encouraged to exercise as able, and limit sodium intake.    GERD/Acid Reflux:  Patient takes Prilosec twice weekly, and would like to taper off completely.  Advised limiting carbohydrates and spicy food within 4 hours of bedtime, but patient seems very aware of triggers.  Recommended discontinuing Prilosec (without taper), as he only takes it episodically, and switching to breakthrough Tums or Pepcid as needed.    Headache:  Ibuprofen PRN.  Recommended stopping ibuprofen, and taking APAP Extra Strength 1-2 tablets (500-1000mg ) Q6 hours PRN due to interaction with lisinopril.  Follow-Up: 1 year  Cherly Hensen, PharmD 03/26/2020 4:21 PM

## 2020-04-24 ENCOUNTER — Other Ambulatory Visit: Payer: Self-pay

## 2021-06-11 ENCOUNTER — Other Ambulatory Visit: Payer: Self-pay

## 2021-06-11 ENCOUNTER — Emergency Department
Admission: EM | Admit: 2021-06-11 | Discharge: 2021-06-11 | Disposition: A | Discharge status: Home / Self Care | Payer: Self-pay | Attending: Emergency Medicine | Admitting: Emergency Medicine

## 2021-06-11 ENCOUNTER — Encounter: Payer: Self-pay | Admitting: Emergency Medicine

## 2021-06-11 DIAGNOSIS — H9202 Otalgia, left ear: Secondary | ICD-10-CM | POA: Insufficient documentation

## 2021-06-11 DIAGNOSIS — Z5321 Procedure and treatment not carried out due to patient leaving prior to being seen by health care provider: Secondary | ICD-10-CM | POA: Insufficient documentation

## 2021-06-11 MED ORDER — CEFDINIR 300 MG PO CAPS
ORAL_CAPSULE | ORAL | 0 refills | Status: DC
  Filled 2021-06-11: qty 14, 7d supply, fill #0

## 2021-06-11 NOTE — ED Triage Notes (Signed)
Pt to ED from home c/o left ear pain tonight.  States sinus infection for last couple days, tonight felt muffled noise in ear and crackles and pops and fluid drained afterward, fluid clear.

## 2021-06-30 ENCOUNTER — Telehealth: Payer: Self-pay | Admitting: Pharmacy Technician

## 2021-06-30 NOTE — Telephone Encounter (Signed)
Patient had requested an appointment to help with the completion of paperwork for [facility]  Patient called to cancel appointment stated that he did not feel well.  Did not want to reschedule appointment.  Requested that paperwork be given to friend, Briscoe Burns.  Loraine Leriche is supposed to pick-up paperwork on 06/30/21 at 2:00p.m.  Sherilyn Dacosta Care Manager [facility]

## 2021-07-03 ENCOUNTER — Other Ambulatory Visit: Payer: Self-pay

## 2021-08-20 ENCOUNTER — Ambulatory Visit: Payer: Self-pay | Admitting: Pharmacy Technician

## 2021-08-20 ENCOUNTER — Other Ambulatory Visit: Payer: Self-pay

## 2021-08-20 DIAGNOSIS — Z79899 Other long term (current) drug therapy: Secondary | ICD-10-CM

## 2021-08-20 NOTE — Progress Notes (Signed)
Completed [facility] application and contract.  Patient agreed to all terms of the [facility] contract.    Patient approved to receive medication assistance at [facility] until time for re-certification in 7846, and as long as eligibility criteria continues to be met.    Provided patient with Civil engineer, contracting based on his particular needs.    [place] [facility]

## 2021-09-08 ENCOUNTER — Other Ambulatory Visit: Payer: Self-pay

## 2021-10-02 ENCOUNTER — Ambulatory Visit: Payer: Self-pay | Admitting: Gerontology

## 2021-10-15 ENCOUNTER — Ambulatory Visit: Payer: Self-pay | Admitting: Gerontology

## 2021-10-22 ENCOUNTER — Ambulatory Visit: Payer: Self-pay | Admitting: Gerontology

## 2021-10-23 ENCOUNTER — Ambulatory Visit: Payer: Self-pay | Admitting: Gerontology

## 2021-10-23 ENCOUNTER — Other Ambulatory Visit: Payer: Self-pay

## 2021-10-23 ENCOUNTER — Encounter: Payer: Self-pay | Admitting: Gerontology

## 2021-10-23 VITALS — BP 146/83 | HR 106 | Temp 97.6°F | Resp 18 | Ht 77.0 in | Wt 269.0 lb

## 2021-10-23 DIAGNOSIS — Z7689 Persons encountering health services in other specified circumstances: Secondary | ICD-10-CM | POA: Insufficient documentation

## 2021-10-23 DIAGNOSIS — R000 Tachycardia, unspecified: Secondary | ICD-10-CM

## 2021-10-23 DIAGNOSIS — R202 Paresthesia of skin: Secondary | ICD-10-CM

## 2021-10-23 DIAGNOSIS — I10 Essential (primary) hypertension: Secondary | ICD-10-CM

## 2021-10-23 MED ORDER — LISINOPRIL 10 MG PO TABS
10.0000 mg | ORAL_TABLET | Freq: Every day | ORAL | 1 refills | Status: DC
  Filled 2021-10-23 – 2021-12-30 (×2): qty 90, 90d supply, fill #0

## 2021-10-23 MED ORDER — LISINOPRIL 10 MG PO TABS
10.0000 mg | ORAL_TABLET | Freq: Every day | ORAL | 1 refills | Status: DC
  Filled 2021-10-23: qty 30, 30d supply, fill #0

## 2021-10-23 NOTE — Progress Notes (Signed)
? ?New Patient Office Visit ? ?Subjective:  ?Patient ID: Wayne Brown, male    DOB: [birth year]  Age: 56 y.o. MRN: [id number] ? ?CC:  ?Chief Complaint  ?Patient presents with  ? Establish Care  ? ? ?HPI ?Wayne Brown is a 56 y/o male who has history of Allergy, Anxiety, Bipolar depression, GERD, Gout, hypertension, prediabetes, PTSD who presents to re establish care and evaluation of his chronic condition. He states that he stopped taking his Lisinopril for many months. He denies chest pain, palpitation, dizziness and vision changes. He also reports intermittent numbness to left hand  which is chronic in nature, and states taking Ibuprofen as needed with moderate relief. He states that he's unable to hold objects for many hours, but denies muscle and motor weakness. He reports wheezing, denies smoking, but he's exposed to second hand smoking. Overall, he states that he's doing well and offers no further complaint. ? ?Past Medical History:  ?Diagnosis Date  ? Allergy   ? Anxiety and depression   ? Bipolar disorder ([place])   ? GERD (gastroesophageal reflux disease)   ? Gout   ? Hypertension   ? Post traumatic stress disorder   ? Prediabetes   ? ? ?Past Surgical History:  ?Procedure Laterality Date  ? ADENOIDECTOMY    ? DENTAL SURGERY    ? TONSILLECTOMY  56 yrs old  ? TYMPANOSTOMY TUBE PLACEMENT    ? ? ?Family History  ?Problem Relation Age of Onset  ? Cancer Father 35  ?     unsure of type  ? Heart disease Father   ? Diabetes Father   ? Colon cancer Mother 48  ? Breast cancer Neg Hx   ? ? ?Social History  ? ?Socioeconomic History  ? Marital status: Single  ?  Spouse name: Not on file  ? Number of children: 3  ? Years of education: GED  ? Highest education level: Not on file  ?Occupational History  ? Occupation: Unemployed  ?Tobacco Use  ? Smoking status: Never  ? Smokeless tobacco: Never  ?Vaping Use  ? Vaping Use: Never used  ?Substance and Sexual Activity  ? Alcohol use: Not Currently  ?  Comment: Last drink  3 months ago  ? Drug use: Not Currently  ?  Types: Marijuana  ?  Comment:  former use at age 78   ? Sexual activity: Yes  ?  Comment: monogomus   ?Other Topics Concern  ? Not on file  ?Social History Narrative  ? Lives at home with his wife.  ? Right-handed.  ? Caffeine use:  4-5 drinks of sweet tea/occasional soda per day.  ? ?Social Determinants of Health  ? ?Financial Resource Strain: Not on file  ?Food Insecurity: No Food Insecurity  ? Worried About Charity fundraiser in the Last Year: Never true  ? Ran Out of Food in the Last Year: Never true  ?Transportation Needs: No Transportation Needs  ? Lack of Transportation (Medical): No  ? Lack of Transportation (Non-Medical): No  ?Physical Activity: Not on file  ?Stress: Not on file  ?Social Connections: Not on file  ?Intimate Partner Violence: Not on file  ? ? ?ROS ?Review of Systems  ?Constitutional: Negative.   ?HENT: Negative.    ?Eyes: Negative.   ?Respiratory:  Positive for wheezing.   ?Cardiovascular: Negative.   ?Gastrointestinal: Negative.   ?Endocrine: Negative.   ?Genitourinary: Negative.   ?Musculoskeletal: Negative.   ?Skin: Negative.   ?Neurological:  Positive for  numbness (chronic intermittent left hand and arm).  ?Hematological: Negative.   ?Psychiatric/Behavioral: Negative.    ? ?Objective:  ? ?Today's Vitals: BP (!) 146/83 (BP Location: Right Arm, Patient Position: Sitting, Cuff Size: Large)   Pulse (!) 106   Temp 97.6 ?F (36.4 ?C) (Oral)   Resp 18   Ht '6\' 5"'  (1.956 m)   Wt 269 lb (122 kg)   SpO2 96%   BMI 31.90 kg/m?  ? ?Physical Exam ?HENT:  ?   Head: Normocephalic and atraumatic.  ?   Nose: Nose normal.  ?   Mouth/Throat:  ?   Mouth: Mucous membranes are moist.  ?Eyes:  ?   Extraocular Movements: Extraocular movements intact.  ?   Conjunctiva/sclera: Conjunctivae normal.  ?   Pupils: Pupils are equal, round, and reactive to light.  ?Cardiovascular:  ?   Rate and Rhythm: Regular rhythm. Tachycardia present.  ?   Pulses: Normal pulses.  ?    Heart sounds: Normal heart sounds.  ?Pulmonary:  ?   Effort: Pulmonary effort is normal.  ?   Breath sounds: Normal breath sounds.  ?Abdominal:  ?   General: Bowel sounds are normal.  ?   Palpations: Abdomen is soft.  ?Genitourinary: ?   Comments: Deferred per patient ?Musculoskeletal:     ?   General: Normal range of motion.  ?   Cervical back: Normal range of motion.  ?Skin: ?   General: Skin is warm.  ?Neurological:  ?   General: No focal deficit present.  ?   Mental Status: He is alert and oriented to person, place, and time. Mental status is at baseline.  ?Psychiatric:     ?   Mood and Affect: Mood normal.     ?   Behavior: Behavior normal.     ?   Thought Content: Thought content normal.     ?   Judgment: Judgment normal.  ? ? ?Assessment & Plan:  ? ?1. Essential hypertension ?- His blood pressure is not under control, his goal should be less than 140/90. He will continue on Lisinopril, DASH diet and exercise as tolerated. ?- lisinopril (ZESTRIL) 10 MG tablet; Take 1 tablet (10 mg total) by mouth once daily.  Dispense: 30 tablet; Refill: 1 ? ?2. Encounter to establish care ?- Routine labs will be checked. ?- Comp Met (CMET); Future ?- CBC w/Diff; Future ?- Lipid panel; Future ?- HgB A1c; Future ?- Vitamin D (25 hydroxy); Future ?- B12 and Folate Panel; Future ?- B12 and Folate Panel ?- Vitamin D (25 hydroxy) ?- HgB A1c ?- Lipid panel ?- CBC w/Diff ?- Comp Met (CMET) ? ?3. Paresthesia of left arm ?- Will recheck Folate and Vitamin B12, was advised to notify clinic and go to the ED for worsening symptoms. He will follow up with Dr Jefm Bryant for multiple  chronic musculoskeletal pain. ? ?4. Tachycardia ?- His heart rate was elevated, denies chest pain, palpitation, and shortness of breath. He was encouraged to increase water intake and go to the ED for worsening symptoms. ? ? ? ? ? ? ?Follow-up: Return in about 3 months (around 01/22/2022), or if symptoms worsen or fail to improve.  ? ?Zahniya Zellars Jerold Coombe, NP ? ?

## 2021-10-23 NOTE — Patient Instructions (Signed)

## 2021-10-24 ENCOUNTER — Other Ambulatory Visit: Payer: Self-pay

## 2021-10-30 ENCOUNTER — Other Ambulatory Visit: Payer: Self-pay | Admitting: Gerontology

## 2021-10-30 ENCOUNTER — Other Ambulatory Visit: Payer: Self-pay

## 2021-10-30 DIAGNOSIS — E538 Deficiency of other specified B group vitamins: Secondary | ICD-10-CM

## 2021-10-30 DIAGNOSIS — E559 Vitamin D deficiency, unspecified: Secondary | ICD-10-CM

## 2021-10-30 DIAGNOSIS — R7303 Prediabetes: Secondary | ICD-10-CM

## 2021-10-30 LAB — CBC WITH DIFFERENTIAL/PLATELET
Basophils Absolute: 0.1 10*3/uL (ref 0.0–0.2)
Basos: 1 %
EOS (ABSOLUTE): 0.3 10*3/uL (ref 0.0–0.4)
Eos: 4 %
Hematocrit: 41.4 % (ref 37.5–51.0)
Hemoglobin: 14.9 g/dL (ref 13.0–17.7)
Immature Grans (Abs): 0 10*3/uL (ref 0.0–0.1)
Immature Granulocytes: 0 %
Lymphocytes Absolute: 1.7 10*3/uL (ref 0.7–3.1)
Lymphs: 23 %
MCH: 31.5 pg (ref 26.6–33.0)
MCHC: 36 g/dL — ABNORMAL HIGH (ref 31.5–35.7)
MCV: 88 fL (ref 79–97)
Monocytes Absolute: 0.5 10*3/uL (ref 0.1–0.9)
Monocytes: 6 %
Neutrophils Absolute: 4.9 10*3/uL (ref 1.4–7.0)
Neutrophils: 66 %
Platelets: 231 10*3/uL (ref 150–450)
RBC: 4.73 x10E6/uL (ref 4.14–5.80)
RDW: 12.9 % (ref 11.6–15.4)
WBC: 7.5 10*3/uL (ref 3.4–10.8)

## 2021-10-30 LAB — COMPREHENSIVE METABOLIC PANEL
ALT: 26 IU/L (ref 0–44)
AST: 20 IU/L (ref 0–40)
Albumin/Globulin Ratio: 1.4 (ref 1.2–2.2)
Albumin: 4.2 g/dL (ref 3.8–4.9)
Alkaline Phosphatase: 161 IU/L — ABNORMAL HIGH (ref 44–121)
BUN/Creatinine Ratio: 9 (ref 9–20)
BUN: 11 mg/dL (ref 6–24)
Bilirubin Total: 0.8 mg/dL (ref 0.0–1.2)
CO2: 22 mmol/L (ref 20–29)
Calcium: 9.3 mg/dL (ref 8.7–10.2)
Chloride: 104 mmol/L (ref 96–106)
Creatinine, Ser: 1.23 mg/dL (ref 0.76–1.27)
Globulin, Total: 3 g/dL (ref 1.5–4.5)
Glucose: 159 mg/dL — ABNORMAL HIGH (ref 70–99)
Potassium: 3.9 mmol/L (ref 3.5–5.2)
Sodium: 140 mmol/L (ref 134–144)
Total Protein: 7.2 g/dL (ref 6.0–8.5)
eGFR: 70 mL/min/{1.73_m2} (ref >59)

## 2021-10-30 LAB — LIPID PANEL
Chol/HDL Ratio: 4.6 ratio (ref 0.0–5.0)
Cholesterol, Total: 144 mg/dL (ref 100–199)
HDL: 31 mg/dL — ABNORMAL LOW (ref >39)
LDL Chol Calc (NIH): 91 mg/dL (ref 0–99)
Triglycerides: 121 mg/dL (ref 0–149)
VLDL Cholesterol Cal: 22 mg/dL (ref 5–40)

## 2021-10-30 LAB — HEMOGLOBIN A1C
Est. average glucose Bld gHb Est-mCnc: 128 mg/dL
Hgb A1c MFr Bld: 6.1 % — ABNORMAL HIGH (ref 4.8–5.6)

## 2021-10-30 LAB — VITAMIN D 25 HYDROXY (VIT D DEFICIENCY, FRACTURES): Vit D, 25-Hydroxy: 13.6 ng/mL — ABNORMAL LOW (ref 30.0–100.0)

## 2021-10-30 LAB — B12 AND FOLATE PANEL
Folate: 6.1 ng/mL (ref >3.0)
Vitamin B-12: 317 pg/mL (ref 232–1245)

## 2021-10-30 MED ORDER — METFORMIN HCL 1000 MG PO TABS
500.0000 mg | ORAL_TABLET | Freq: Every day | ORAL | 0 refills | Status: DC
  Filled 2021-10-30: qty 15, 30d supply, fill #0
  Filled 2021-11-24: qty 15, 30d supply, fill #1

## 2021-10-30 MED ORDER — CYANOCOBALAMIN 500 MCG PO TABS
500.0000 ug | ORAL_TABLET | Freq: Every day | ORAL | 3 refills | Status: DC
Start: 2021-10-30 — End: 2022-03-31
  Filled 2021-10-30: qty 30, 30d supply, fill #0
  Filled 2021-11-24: qty 30, 30d supply, fill #1
  Filled 2021-12-23: qty 30, 30d supply, fill #0
  Filled 2022-01-26: qty 30, 30d supply, fill #1
  Filled 2022-02-23 (×2): qty 30, 30d supply, fill #2

## 2021-10-30 MED ORDER — VITAMIN D (ERGOCALCIFEROL) 1.25 MG (50000 UNIT) PO CAPS
50000.0000 [IU] | ORAL_CAPSULE | ORAL | 0 refills | Status: DC
Start: 2021-10-30 — End: 2022-01-22
  Filled 2021-10-30: qty 4, 28d supply, fill #0
  Filled 2021-11-24: qty 4, 28d supply, fill #1
  Filled 2021-12-23: qty 4, 28d supply, fill #0

## 2021-11-21 ENCOUNTER — Other Ambulatory Visit: Payer: Self-pay

## 2021-11-24 ENCOUNTER — Other Ambulatory Visit: Payer: Self-pay

## 2021-11-25 ENCOUNTER — Other Ambulatory Visit: Payer: Self-pay

## 2021-11-26 ENCOUNTER — Other Ambulatory Visit: Payer: Self-pay

## 2021-12-09 ENCOUNTER — Ambulatory Visit: Payer: Self-pay | Admitting: Rheumatology

## 2021-12-09 DIAGNOSIS — M5412 Radiculopathy, cervical region: Secondary | ICD-10-CM

## 2021-12-09 NOTE — Patient Instructions (Signed)
Please help patient apply for [facility] then let us know and we will schedule cervical spine MRI and refer to physiatry

## 2021-12-09 NOTE — Progress Notes (Signed)
[facility]  PROGRESS NOTE  Patient:Wayne Brown Male   DOB:10-Feb-1966     56 y.o.  WGN:[id number]  Visit Date: 12/09/2021  HPI:  56 year old white male.  Prior Engineer, materials and wrestler.  History hypertension bipolar prediabetes Prior thumb fracture 2017 Known cervical disc disease.  Was evaluated in 2020 with similar.  Has significant left arm pain goes down to the hand sometimes the hands get numb and he has some weakness in the hand.  Neurosurgery felt they might give him some pain relief but would not help his pinched nerve.  He did not have surgery.  In the interim he has had episodic pain and cramps left hand.  Does not really bother him at night.  Sometimes left fifth finger will have sharp pain or go numb.  He has tried gabapentin with poor toleration poor sleep.  Has tried physical therapy of the cervical spine which did not help He has not had epidurals Feels like he cannot be employable because of his chronic left numbness and nerve damage.     Past Medical History:  Diagnosis Date   Allergy    Anxiety and depression    Bipolar disorder ([facility])    GERD (gastroesophageal reflux disease)    Gout    Hypertension    Post traumatic stress disorder    Prediabetes     Past Surgical History:  Procedure Laterality Date   ADENOIDECTOMY     DENTAL SURGERY     TONSILLECTOMY  56 yrs old   TYMPANOSTOMY TUBE PLACEMENT      Social History   Tobacco Use   Smoking status: Never   Smokeless tobacco: Never  Substance Use Topics   Alcohol use: Not Currently    Comment: Last drink 3 months ago     MEDICATIONS: Current Outpatient Medications  Medication Sig Dispense Refill   lisinopril (ZESTRIL) 10 MG tablet Take 1 tablet (10 mg total) by mouth once daily. 90 tablet 1   metFORMIN (GLUCOPHAGE) 1000 MG tablet Take 1/2 tablet (500 mg total) by mouth once daily with breakfast. 30 tablet 0   omeprazole (PRILOSEC) 20 MG capsule TAKE ONE CAPSULE BY  MOUTH EVERY DAY 90 capsule 0   vitamin B-12 (CYANOCOBALAMIN) 500 MCG tablet Take 1 tablet (500 mcg total) by mouth once daily. 30 tablet 3   Vitamin D, Ergocalciferol, (DRISDOL) 1.25 MG (50000 UNIT) CAPS capsule Take 1 capsule (50,000 Units total) by mouth once every 7 (seven) days. 16 capsule 0   No current facility-administered medications for this visit.     ALLERGIES Allergies  Allergen Reactions   Erythromycin Other (See Comments)    Depression per pt; occurred as a child   Tramadol Nausea And Vomiting    Headaches and nausea   Lithium Rash    "lithium rash" per pt   Penicillins Rash    Per pt rxn was an infant, familial; rash with welts   Toradol [Ketorolac Tromethamine] Nausea And Vomiting and Rash   Vicodin [Hydrocodone-Acetaminophen] Nausea And Vomiting and Rash    Skin cold and vomiting     PHYSICAL EXAM: There were no vitals taken for this visit. Pleasant male.  Clear chest.  No significant edema Musculoskeletal: Reasonable range of motion of cervical spine.  Shoulder moves well.  Elbows without synovitis.  No hand or thenar atrophy.  Hips move well.  No knee effusions Neurologic: Right triceps 2+ absent left triceps.  Absent brachial radialis and and biceps reflexes.  Reasonable grip bilaterally.  Decreased extension power of his fingers in the left hand.  Has normal opponents.  Normal pin.  Symmetric knee jerks.   ASSESSMENT: Chronic left cervical radiculopathy.  Significant left foraminal stenosis on MR of 2020 with some weakness in the hand episodic, cramping numbness Cervical disc disease -Poor toleration of gabapentin -No response to physical therapy Reflux Bipolar Hypertension Prediabetes    PLAN: I suspect he will always have some weakness in the left hand.  He has adapted.  He might benefit by epidural for some of the symptomatic pain in the left arm.  Would need charity care and up-to-date MRI and then physiatry referral.  He will apply for charity  care at [facility] and let us know if he receives that so we can arrange up-to-date MRI and referral to physiatry or pain clinic    G. Al Pimple. MD           12/09/2021,  9:50 AM

## 2021-12-23 ENCOUNTER — Other Ambulatory Visit: Payer: Self-pay

## 2021-12-24 ENCOUNTER — Other Ambulatory Visit: Payer: Self-pay

## 2021-12-30 ENCOUNTER — Other Ambulatory Visit: Payer: Self-pay | Admitting: Gerontology

## 2021-12-30 ENCOUNTER — Other Ambulatory Visit: Payer: Self-pay

## 2021-12-30 DIAGNOSIS — R7303 Prediabetes: Secondary | ICD-10-CM

## 2021-12-30 MED FILL — Metformin HCl Tab 500 MG: ORAL | 60 days supply | Qty: 60 | Fill #0 | Status: AC

## 2021-12-31 ENCOUNTER — Other Ambulatory Visit: Payer: Self-pay

## 2022-01-22 ENCOUNTER — Other Ambulatory Visit: Payer: Self-pay

## 2022-01-22 ENCOUNTER — Ambulatory Visit: Payer: Self-pay | Admitting: Gerontology

## 2022-01-22 ENCOUNTER — Encounter: Payer: Self-pay | Admitting: Gerontology

## 2022-01-22 VITALS — BP 133/86 | HR 91 | Temp 97.7°F | Ht 76.5 in | Wt 266.8 lb

## 2022-01-22 DIAGNOSIS — H6120 Impacted cerumen, unspecified ear: Secondary | ICD-10-CM | POA: Insufficient documentation

## 2022-01-22 DIAGNOSIS — Z0000 Encounter for general adult medical examination without abnormal findings: Secondary | ICD-10-CM

## 2022-01-22 DIAGNOSIS — I10 Essential (primary) hypertension: Secondary | ICD-10-CM

## 2022-01-22 DIAGNOSIS — Z8659 Personal history of other mental and behavioral disorders: Secondary | ICD-10-CM

## 2022-01-22 DIAGNOSIS — K219 Gastro-esophageal reflux disease without esophagitis: Secondary | ICD-10-CM

## 2022-01-22 DIAGNOSIS — H6121 Impacted cerumen, right ear: Secondary | ICD-10-CM

## 2022-01-22 DIAGNOSIS — R7303 Prediabetes: Secondary | ICD-10-CM

## 2022-01-22 MED ORDER — LISINOPRIL 10 MG PO TABS
10.0000 mg | ORAL_TABLET | Freq: Every day | ORAL | 1 refills | Status: DC
  Filled 2022-01-22 – 2022-04-17 (×2): qty 90, 90d supply, fill #0
  Filled 2022-05-25: qty 90, 90d supply, fill #1

## 2022-01-22 MED ORDER — OMEPRAZOLE 20 MG PO CPDR
20.0000 mg | DELAYED_RELEASE_CAPSULE | Freq: Every day | ORAL | 0 refills | Status: DC
  Filled 2022-01-22: qty 90, 90d supply, fill #0

## 2022-01-22 MED ORDER — METFORMIN HCL 500 MG PO TABS
500.0000 mg | ORAL_TABLET | Freq: Every day | ORAL | 1 refills | Status: DC
  Filled 2022-01-22 – 2022-02-23 (×2): qty 90, 90d supply, fill #0
  Filled 2022-05-25: qty 90, 90d supply, fill #1

## 2022-01-22 NOTE — Progress Notes (Signed)
Established Patient Office Visit  Subjective   Patient ID: Wayne Brown, male    DOB: 1966-06-02  Age: 56 y.o. MRN: [id number]  Chief Complaint  Patient presents with   Follow-up    Labs 10/23/2021    HPI Wayne Brown is a 56 y/o male who has history of Allergy, Anxiety, Bipolar depression, GERD, Gout, hypertension, prediabetes, PTSD who presents for routine follow up visit. He was seen by Dr  Jefm Bryant with regards to his left arm pain, was advised to complete [facility] financial application for an MRI, he states that he's getting necessary documents. He c/o feeling of fullness to his right ear that started 3 weeks ago, denies otalgia, tinnitus and discharge. He states that he's compliant with his medications, denies side effects and continues to make healthy lifestyle changes. He requests testosterone levels to be checked. He states that he has a history of depression and PTSD, has not been back at [facility] since his Psychiatrist died. He states that his mood is good, denies suicidal nor homicidal ideation, but requests a mental health referral. He also request needing a letter stating that he can keep his cat for emotional support.  Overall, he states that he is doing well and offers no further complaint.  Review of Systems  Constitutional: Negative.   HENT: Negative.         Fullness sensation to right ear  Respiratory: Negative.    Cardiovascular: Negative.   Neurological: Negative.   Psychiatric/Behavioral: Negative.        Objective:     BP 133/86 (BP Location: Left Arm, Patient Position: Sitting, Cuff Size: Large)   Pulse 91   Temp 97.7 F (36.5 C) (Oral)   Ht 6' 4.5" (1.943 m)   Wt 266 lb 12.8 oz (121 kg)   SpO2 (!) 83%   BMI 32.05 kg/m  BP Readings from Last 3 Encounters:  01/22/22 133/86  10/23/21 (!) 146/83  06/11/21 (!) 160/97   Wt Readings from Last 3 Encounters:  01/22/22 266 lb 12.8 oz (121 kg)  10/23/21 269 lb (122 kg)  06/11/21 250 lb (113.4 kg)       Physical Exam HENT:     Right Ear: There is impacted cerumen.     Mouth/Throat:     Mouth: Mucous membranes are moist.  Eyes:     Extraocular Movements: Extraocular movements intact.     Conjunctiva/sclera: Conjunctivae normal.     Pupils: Pupils are equal, round, and reactive to light.  Cardiovascular:     Rate and Rhythm: Normal rate and regular rhythm.     Pulses: Normal pulses.     Heart sounds: Normal heart sounds.  Pulmonary:     Effort: Pulmonary effort is normal.     Breath sounds: Normal breath sounds.  Skin:    General: Skin is warm.  Neurological:     General: No focal deficit present.     Mental Status: He is alert and oriented to person, place, and time. Mental status is at baseline.  Psychiatric:        Mood and Affect: Mood normal.        Behavior: Behavior normal.        Thought Content: Thought content normal.        Judgment: Judgment normal.      No results found for any visits on 01/22/22.  Last CBC Lab Results  Component Value Date   WBC 7.5 10/23/2021   HGB 14.9 10/23/2021  HCT 41.4 10/23/2021   MCV 88 10/23/2021   MCH 31.5 10/23/2021   RDW 12.9 10/23/2021   PLT 231 15/11/6977   Last metabolic panel Lab Results  Component Value Date   GLUCOSE 159 (H) 10/23/2021   NA 140 10/23/2021   K 3.9 10/23/2021   CL 104 10/23/2021   CO2 22 10/23/2021   BUN 11 10/23/2021   CREATININE 1.23 10/23/2021   EGFR 70 10/23/2021   CALCIUM 9.3 10/23/2021   PROT 7.2 10/23/2021   ALBUMIN 4.2 10/23/2021   LABGLOB 3.0 10/23/2021   AGRATIO 1.4 10/23/2021   BILITOT 0.8 10/23/2021   ALKPHOS 161 (H) 10/23/2021   AST 20 10/23/2021   ALT 26 10/23/2021   ANIONGAP 6 (L) 10/06/2013   Last lipids Lab Results  Component Value Date   CHOL 144 10/23/2021   HDL 31 (L) 10/23/2021   LDLCALC 91 10/23/2021   TRIG 121 10/23/2021   CHOLHDL 4.6 10/23/2021   Last hemoglobin A1c Lab Results  Component Value Date   HGBA1C 6.1 (H) 10/23/2021   Last vitamin D Lab  Results  Component Value Date   VD25OH 13.6 (L) 10/23/2021   Last vitamin B12 and Folate Lab Results  Component Value Date   VITAMINB12 317 10/23/2021   FOLATE 6.1 10/23/2021      The 10-year ASCVD risk score (Arnett DK, et al., 2019) is: 6.6%    Assessment & Plan:   1. Essential hypertension -His blood pressure is under control, he will continue on current medication, DASH diet and exercise as tolerated. - lisinopril (ZESTRIL) 10 MG tablet; Take 1 tablet (10 mg total) by mouth once daily.  Dispense: 90 tablet; Refill: 1  2. Prediabetes -He will continue on current medication, advised to continue on low carbohydrate/no concentrated sweet diet and exercise as tolerated. - metFORMIN (GLUCOPHAGE) 500 MG tablet; Take 1 tablet (500 mg total) by mouth daily with breakfast.  Dispense: 90 tablet; Refill: 1 - HgB A1c; Future - HgB A1c  3. Gastroesophageal reflux disease -He will continue his current medication. -Avoid spicy, fatty and fried food -Avoid sodas and sour juices -Avoid heavy meals -Avoid eating 4 hours before bedtime -Elevate head of bed at night - omeprazole (PRILOSEC) 20 MG capsule; Take 1 capsule (20 mg total) by mouth daily.  Dispense: 90 capsule; Refill: 0  4. Healthcare maintenance -Routine labs will be checked. - Vitamin D (25 hydroxy); Future - Testosterone; Future - Testosterone - Vitamin D (25 hydroxy)  5. History of depression -He will follow-up with [facility] behavioral health.  He was advised to call the crisis helpline with worsening symptoms.  He was provided with a letter for his apartment complex for him to keep his cat for emotional support.  6. Impacted cerumen of right ear -His right ear was impacted with cerumen, and he was removed, and inner ear was normal. - Ear cerumen removal; Future - Ear cerumen removal   Return in about 13 weeks (around 04/23/2022), or if symptoms worsen or fail to improve.    Wava Kildow Jerold Coombe, NP

## 2022-01-23 LAB — HEMOGLOBIN A1C
Est. average glucose Bld gHb Est-mCnc: 126 mg/dL
Hgb A1c MFr Bld: 6 % — ABNORMAL HIGH (ref 4.8–5.6)

## 2022-01-23 LAB — TESTOSTERONE: Testosterone: 290 ng/dL (ref 264–916)

## 2022-01-23 LAB — VITAMIN D 25 HYDROXY (VIT D DEFICIENCY, FRACTURES): Vit D, 25-Hydroxy: 33.4 ng/mL (ref 30.0–100.0)

## 2022-01-26 ENCOUNTER — Other Ambulatory Visit: Payer: Self-pay

## 2022-01-27 ENCOUNTER — Other Ambulatory Visit: Payer: Self-pay

## 2022-02-23 ENCOUNTER — Other Ambulatory Visit: Payer: Self-pay

## 2022-02-24 ENCOUNTER — Other Ambulatory Visit: Payer: Self-pay

## 2022-02-26 ENCOUNTER — Ambulatory Visit: Payer: Self-pay | Admitting: Licensed Clinical Social Worker

## 2022-02-26 DIAGNOSIS — Z7689 Persons encountering health services in other specified circumstances: Secondary | ICD-10-CM

## 2022-03-03 ENCOUNTER — Institutional Professional Consult (permissible substitution): Payer: Self-pay | Admitting: Licensed Clinical Social Worker

## 2022-03-03 ENCOUNTER — Ambulatory Visit: Payer: Self-pay | Admitting: Licensed Clinical Social Worker

## 2022-03-03 DIAGNOSIS — F411 Generalized anxiety disorder: Secondary | ICD-10-CM

## 2022-03-03 DIAGNOSIS — F339 Major depressive disorder, recurrent, unspecified: Secondary | ICD-10-CM
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
## 2022-03-03 NOTE — BH Specialist Note (Signed)
Integrated Behavioral Health Follow Up In-Person Visit  MRN: [id number] Name: Wayne Brown  Number of [place] Clinician visits: No data recorded Session Start time: No data recorded  Session End time: No data recorded Total time in minutes: No data recorded  Types of Service: Individual psychotherapy  Interpretor:No. Interpretor Name and Language: N/A  Subjective: Wayne Brown is a 56 y.o. male accompanied by  himself Patient was referred by Carlyon Shadow, NP for mental health. Patient reports the following symptoms/concerns: The patient reports that he has been doing okay since his last follow-up session.  He discussed relationship stressors that are impacting his life currently.  The patient stated that h he suspects that his girlfriend is in an inappropriate relationship with her stepfather whom she lives with.  The patient explained that he is supposed to keep his relationship with her secret and is not allowed to post about their relationship on social media, tell friends or family they are dating, and when he visits is supposed to act as if they are just friends.  The patient noted that he has known his girlfriend for over a decade before they started dating and is trying to give space for her to be comfortable telling others about their relationship.  However, the patient noted he is frustrated and tired of waiting and has set a firm date as an ultimatum for either their relationship to be made public or he will walk away.  The patient noted that he continues to struggle with irritability however he stated that by walking away, using deep breathing, and mindfulness he is able to manage his symptoms more effectively.  The patient denied any suicidal or homicidal thoughts. Duration of problem: Years; Severity of problem: moderate  Objective: Mood: Euthymic and Affect: Appropriate Risk of harm to self or others: No plan to harm self or others  Life  Context: Family and Social: See above School/Work: See above Self-Care: See above Life Changes: See above  Patient and/or Family's Strengths/Protective Factors: Social connections, Concrete supports in place (healthy food, safe environments, etc.), Sense of purpose, and Physical Health (exercise, healthy diet, medication compliance, etc.)  Goals Addressed: Patient will:  Reduce symptoms of: agitation, anxiety, depression, insomnia, and stress   Increase knowledge and/or ability of: coping skills, healthy habits, self-management skills, and stress reduction   Demonstrate ability to: Increase healthy adjustment to current life circumstances  Progress towards Goals: Ongoing  Interventions: Interventions utilized:  CBT Cognitive Behavioral Therapywas utilized by the clinician during today's follow up session. Clinician met with patient to identify needs related to stressors and functioning, and assess and monitor for signs and symptoms of anxiety and depression, and assess safety. The clinician processed with the patient how they have been doing since the last follow-up session.  Clinician intervened with positive regard and optimism to validate client's emotions, and supported client in exploring ways to utilize assertive communication and increase his implementation of coping skills to deal with his current life circumstances.  The session ended with scheduling.   Assessment: Patient currently experiencing see above.   Patient may benefit from see above.  Plan: Follow up with behavioral health clinician on : March 10, 2022 at 3:00 PM. Behavioral recommendations:  Referral(s): [place] (In Clinic) "From scale of 1-10, how likely are you to follow plan?":   Lesli Albee, LCSWA

## 2022-03-10 ENCOUNTER — Ambulatory Visit: Payer: Self-pay | Admitting: Licensed Clinical Social Worker

## 2022-03-10 DIAGNOSIS — F339 Major depressive disorder, recurrent, unspecified: Secondary | ICD-10-CM

## 2022-03-10 DIAGNOSIS — F411 Generalized anxiety disorder: Secondary | ICD-10-CM

## 2022-03-10 NOTE — BH Specialist Note (Signed)
Integrated Behavioral Health Follow Up In-Person Visit  MRN: [id number] Name: Wayne Brown  Number of [place] Clinician visits: No data recorded Session Start time: No data recorded  Session End time: No data recorded Total time in minutes: No data recorded  Types of Service: Individual psychotherapy  Interpretor:No. Interpretor Name and Language: N/A  Subjective: Wayne Brown is a 56 y.o. male accompanied by  himself  Patient was referred by Carlyon Shadow, NP for mental health . Patient reports the following symptoms/concerns: The patient noted he has been doing about the same since his last follow-up session.  Wayne Brown discussed his relationships with his adult children.  He noted that he has not seen his youngest son in a couple of years due to his son's opioid addiction.  Wayne Brown discussed feelings of guilt, however, he shared that he understands this is out of his control.  Wayne Brown shared that one of his adult sons is struggling in his relationship and that he is often pulled into their disagreements.  The patient shared several text and noted that it is difficult for him not to respond.  The patient stated to ventilate he occasionally turns to social media and posts messages intended specifically to address other people without calling them out by name.  The patient stated that he often shares his religious believes online as well.  He noted this is caused him conflict with family and friends.  The patient shared he continues to struggle with difficulty falling asleep and remaining asleep.  He noted that he sticks to a bedtime routine and tries to go to bed and wake up at the same time.  He explained occasionally he will have a snack in the evening before bed.  Wayne Brown noted he has tried melatonin and did not find it helpful.  The patient denied any suicidal or homicidal thoughts. Duration of problem: years ; Severity of problem: moderate  Life Context: Family and  Social: See above School/Work: See above Self-Care: See above Life Changes: See above  Patient and/or Family's Strengths/Protective Factors: Social connections, Concrete supports in place (healthy food, safe environments, etc.), Sense of purpose, and Physical Health (exercise, healthy diet, medication compliance, etc.)  Goals Addressed: Patient will:  Reduce symptoms of: agitation, anxiety, depression, obsessions, and stress   Increase knowledge and/or ability of: coping skills, healthy habits, self-management skills, and stress reduction   Demonstrate ability to: Increase healthy adjustment to current life circumstances  Progress towards Goals: Ongoing  Interventions: Interventions utilized:  CBT Cognitive Behavioral Therapywas utilized by the clinician during today's follow up session. Clinician met with patient to identify needs related to stressors and functioning, and assess and monitor for signs and symptoms of anxiety and depression, and assess safety. The clinician processed with the patient how they have been doing since the last follow-up session. Clinician assessed the patients pattern of sleep, bedtime routines, activities associated with the bed, activity and energy level while awake, night time snacking, stimulant use, daytime napping, total sleep amounts. Clinician explored the patients thoughts and associated emotions regarding sleep.  Clinician introduced the patient to sleep hygiene and encouraged him to practice throughout this week and to keep a sleep diary.  The session ended with scheduling Standardized Assessments completed: Patient declined screening   Assessment: Patient currently experiencing see above.   Patient may benefit from see above.  Plan: Follow up with behavioral health clinician on : March 16, 2022 at 4:00 PM. Behavioral recommendations:  Referral(s): [place] (  In Clinic) "From scale of 1-10, how likely are you to  follow plan?":   Lesli Albee, LCSWA

## 2022-03-11 ENCOUNTER — Telehealth: Payer: Self-pay | Admitting: Gerontology

## 2022-03-11 NOTE — Telephone Encounter (Signed)
Called pt to give info on [facility]. Pt needs help with filing for disability and medicaid.Left message to call back.

## 2022-03-17 ENCOUNTER — Ambulatory Visit: Payer: Self-pay | Admitting: Licensed Clinical Social Worker

## 2022-03-17 ENCOUNTER — Telehealth: Payer: Self-pay | Admitting: Gerontology

## 2022-03-17 NOTE — Telephone Encounter (Signed)
Called pt and rescheduled for Thursday at 4. Pt verbalized understanding.

## 2022-03-19 ENCOUNTER — Ambulatory Visit: Payer: Self-pay | Admitting: Licensed Clinical Social Worker

## 2022-03-19 DIAGNOSIS — F331 Major depressive disorder, recurrent, moderate: Secondary | ICD-10-CM

## 2022-03-19 DIAGNOSIS — F411 Generalized anxiety disorder: Secondary | ICD-10-CM
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
## 2022-03-26 ENCOUNTER — Ambulatory Visit: Payer: Self-pay | Admitting: Licensed Clinical Social Worker

## 2022-03-26 DIAGNOSIS — F411 Generalized anxiety disorder: Secondary | ICD-10-CM

## 2022-03-26 DIAGNOSIS — F331 Major depressive disorder, recurrent, moderate: Secondary | ICD-10-CM

## 2022-03-26 NOTE — BH Specialist Note (Signed)
Integrated Behavioral Health Follow Up In-Person Visit  MRN: [id number] Name: Wayne Brown  Number of Integrated Behavioral Health Clinician visits: No data recorded Session Start time: No data recorded  Session End time: No data recorded Total time in minutes: No data recorded  Types of Service: Individual psychotherapy  Interpretor:No. Interpretor Name and Language: N/A  Subjective: Wayne Brown is a 56 y.o. male accompanied by  himself Patient was referred by Elizabeth Chioma, NP for mental health. Patient reports the following symptoms/concerns: Patient reports that he has been doing about the same since his last follow-up visit.  David discussed his relationship with his brother growing up and noted that he had a premonition that something bad was going to happen to him the day before he died.  The patient discussed that he often gets premonitions or knowledge before certain events happen.  The patient shared that his father was a Native American and that he believes in spirit animals and guides.  He explained that when his dad died at [place] appeared to him and to his mother and he feels that his his father resigned from his father which returns to him when he is in need.  The patient discussed other spiritual views and believes that he draws from for strength during distressing times in his life.  The patient discussed his relationship with his sister and his mother and noted that he feels like he has to be the glue of his family although his efforts are usually met with resistance.  The patient denied any suicidal or homicidal thoughts. Duration of problem: Years; Severity of problem: moderate  Objective: Mood: Euthymic and Affect: Appropriate Risk of harm to self or others: No plan to harm self or others  Life Context: Family and Social: see above School/Work: see above Self-Care: see above Life Changes: see above  Patient and/or Family's Strengths/Protective  Factors: Social connections, Sense of purpose, and Physical Health (exercise, healthy diet, medication compliance, etc.)  Goals Addressed: Patient will:  Reduce symptoms of: agitation, anxiety, depression, insomnia, and stress   Increase knowledge and/or ability of: coping skills, healthy habits, self-management skills, and stress reduction   Demonstrate ability to: Increase healthy adjustment to current life circumstances  Progress towards Goals: Ongoing  Interventions: Interventions utilized:  CBT Cognitive Behavioral Therapywas utilized by the clinician during today's follow up session. Clinician met with patient to identify needs related to stressors and functioning, and assess and monitor for signs and symptoms of anxiety and depression, and assess safety. The clinician processed with the patient how they have been doing since the last follow-up session.  Clinician explored the nature of the patient's family complex and their perceived causes.  Clinician worked to demonstrate consistent empathy toward the patient's feelings and struggles verbalized positive regard towards an affirmation of the patient in order to strengthen the therapeutic alliance. Clinician educated the patient on resistance to change in styles of relating to other family members is usually high and that change takes concerted effort by all members and encouraged the patient to focus on what was in his control versus what was not. The session ended with scheduling. Patient currently experiencing see above.   Patient may benefit from see above.  Plan: Follow up with behavioral health clinician on : 04/02/2022 at 5:00 PM  Behavioral recommendations:  Referral(s): [facility] (In Clinic) "From scale of 1-10, how likely are you to follow plan?":   Heather J Pruitt, LCSWA   

## 2022-03-31 ENCOUNTER — Other Ambulatory Visit: Payer: Self-pay | Admitting: Gerontology

## 2022-03-31 ENCOUNTER — Other Ambulatory Visit: Payer: Self-pay

## 2022-03-31 DIAGNOSIS — E538 Deficiency of other specified B group vitamins: Secondary | ICD-10-CM

## 2022-03-31 MED FILL — Cyanocobalamin Tab 500 MCG: ORAL | 30 days supply | Qty: 30 | Fill #0 | Status: AC

## 2022-04-02 ENCOUNTER — Ambulatory Visit: Payer: Self-pay | Admitting: Licensed Clinical Social Worker

## 2022-04-02 ENCOUNTER — Other Ambulatory Visit: Payer: Self-pay

## 2022-04-02 DIAGNOSIS — F411 Generalized anxiety disorder: Secondary | ICD-10-CM

## 2022-04-02 DIAGNOSIS — F331 Major depressive disorder, recurrent, moderate: Secondary | ICD-10-CM

## 2022-04-02 NOTE — BH Specialist Note (Signed)
Integrated Behavioral Health Follow Up In-Person Visit  MRN: [id number] Name: Wayne Brown  Number of [place] Clinician visits: No data recorded Session Start time: No data recorded  Session End time: No data recorded Total time in minutes: No data recorded  Types of Service: Individual psychotherapy  Interpretor:No. Interpretor Name and Language: n/a  Subjective: Wayne Brown is a 56 y.o. male accompanied by  himself Patient was referred by Carlyon Shadow, NP for mental health. Patient reports the following symptoms/concerns: The patient reports that he has been doing about the same since his last follow-up visit.  He noted that he continues to experience sleep difficulties nightly.  He shared that noise coming from his neighbors apartments on both sides of his makes him very angry at times however, he talks himself through these moments and uses his coping skills such as distraction and petting his cat to help him calm down.  The patient discussed several situational stressors that are impacting his life.  He noted he was looking forward to spending some time with his girlfriend.  The patient discussed feeling protective of her children and especially people whom he sees as the under dog.  The patient stated that he often becomes tearful while watching television and certain shows he finds triggering and is unable to watch especially if it has a seen where the child or animals injured.  The patient denied any suicidal or homicidal thoughts. Duration of problem: Years; Severity of problem: moderate  Objective: Mood: Euthymic and Affect: Appropriate Risk of harm to self or others: No plan to harm self or others  Life Context: Family and Social: see above School/Work: see above Self-Care: see above Life Changes: see above  Patient and/or Family's Strengths/Protective Factors: Social connections, Concrete supports in place (healthy food, safe  environments, etc.), Sense of purpose, and Physical Health (exercise, healthy diet, medication compliance, etc.)  Goals Addressed: Patient will:  Reduce symptoms of: agitation, anxiety, depression, insomnia, and stress   Increase knowledge and/or ability of: coping skills, healthy habits, self-management skills, and stress reduction   Demonstrate ability to: Increase healthy adjustment to current life circumstances and Increase adequate support systems for patient/family  Progress towards Goals: Ongoing  Interventions: Interventions utilized:  CBT Cognitive Behavioral Therapywas utilized by the clinician during today's follow up session. Clinician met with patient to identify needs related to stressors and functioning, and assess and monitor for signs and symptoms of anxiety and depression, and assess safety. The clinician processed with the patient how they have been doing since the last follow-up session. Clinician measured the patient's anxiety and depression on a numerical scale. The clinician encouraged the patient to utilize their coping skills to deal with their current life circumstances.  The session ended with scheduling. Standardized Assessments completed: GAD-7 and PHQ 9 GAD-7= 17 PHQ-9= 16   Assessment: Patient currently experiencing see above.   Patient may benefit from see above.  Plan: Follow up with behavioral health clinician on : 04/09/2022 at 6:00 PM  Behavioral recommendations:  Referral(s): [place] (In Clinic) "From scale of 1-10, how likely are you to follow plan?":   Lesli Albee, LCSWA

## 2022-04-09 ENCOUNTER — Institutional Professional Consult (permissible substitution): Payer: Self-pay | Admitting: Licensed Clinical Social Worker

## 2022-04-09 ENCOUNTER — Ambulatory Visit: Payer: Self-pay | Admitting: Licensed Clinical Social Worker

## 2022-04-09 DIAGNOSIS — F331 Major depressive disorder, recurrent, moderate: Secondary | ICD-10-CM

## 2022-04-09 DIAGNOSIS — F411 Generalized anxiety disorder: Secondary | ICD-10-CM

## 2022-04-09 NOTE — BH Specialist Note (Signed)
Integrated Behavioral Health Follow Up In-Person Visit  MRN: [id number] Name: Wayne Brown  Number of [place] Clinician visits: No data recorded Session Start time: No data recorded  Session End time: No data recorded Total time in minutes: No data recorded  Types of Service: Individual psychotherapy  Interpretor:No. Interpretor Name and Language: N/A  Subjective: Wayne Brown is a 56 y.o. male accompanied by  himself  Patient was referred by Carlyon Shadow, NP for mental health. Patient reports the following symptoms/concerns: The patient reports that he has been doing the same since his last follow-up session.  Wayne Brown noted that he has started to look for work however with his physical health and mental health issues he is uncertain on next steps.  He stated he would like to file disability but struggles with reading and writing comprehension.  The patient shared that he did work with [facility] [facility] [facility] several years ago they completed an IQ test and he was told he scored very low.  The patient noted that he felt very anxious several times this week he stated that whenever financial stressors in his life increase usually his anxiety and depression also increase.  The patient noted that he has tried to make several videos and to use mindfulness as a way to cope, but did not feel they were helpful this week.  The patient discussed relationship and help stressors present in his life.  Wayne Brown denied any suicidal or homicidal thoughts. Duration of problem: Years; Severity of problem: moderate   Objective: Mood: Euthymic and Affect: Appropriate Risk of harm to self or others: No plan to harm self or others  Life Context: Family and Social: See above School/Work: See above Self-Care: See above Life Changes: See above  Patient and/or Family's Strengths/Protective Factors: Social connections, Concrete supports in place (healthy food, safe  environments, etc.), Sense of purpose, and Physical Health (exercise, healthy diet, medication compliance, etc.)  Goals Addressed: Patient will:  Reduce symptoms of: agitation, compulsions, depression, insomnia, and stress   Increase knowledge and/or ability of: coping skills, healthy habits, self-management skills, and stress reduction   Demonstrate ability to: Increase healthy adjustment to current life circumstances  Progress towards Goals: Ongoing  Interventions: Interventions utilized:  CBT Cognitive Behavioral Therapywas utilized by the clinician during today's follow up session. Clinician met with patient to identify needs related to stressors and functioning, and assess and monitor for signs and symptoms of anxiety and depression, and assess safety. The clinician processed with the patient how they have been doing since the last follow-up session.  Clinician continued to teach the patient calming relaxation and mindfulness skills and how to discriminate better between relaxation and tension and taught the client how to apply the skills in their daily life. Clinician assigned the patient homework in which they will use mindfulness skills daily gradually applying them progressively from not anxiety provoking to anxiety provoking situations and worked with the patient to resolve obstacles toward sustained implementation.  The session ended with scheduling. Standardized Assessments completed:  Due to time constraints will complete at a follow-up.  Assessment: Patient currently experiencing see above.   Patient may benefit from see above.  Plan: Follow up with behavioral health clinician on : April 23, 2022 at 5:00 PM. Behavioral recommendations:  Referral(s): [place] (In Clinic) "From scale of 1-10, how likely are you to follow plan?":   Lesli Albee, LCSWA

## 2022-04-17 ENCOUNTER — Other Ambulatory Visit: Payer: Self-pay

## 2022-04-19 MED FILL — Cyanocobalamin Tab 500 MCG: ORAL | 30 days supply | Qty: 30 | Fill #1 | Status: CN

## 2022-04-20 ENCOUNTER — Other Ambulatory Visit: Payer: Self-pay

## 2022-04-20 MED FILL — Cyanocobalamin Tab 500 MCG: ORAL | 30 days supply | Qty: 30 | Fill #1 | Status: CN

## 2022-04-21 ENCOUNTER — Other Ambulatory Visit: Payer: Self-pay

## 2022-04-21 MED FILL — Cyanocobalamin Tab 500 MCG: ORAL | 30 days supply | Qty: 30 | Fill #1 | Status: AC

## 2022-04-23 ENCOUNTER — Ambulatory Visit: Payer: Self-pay | Admitting: Licensed Clinical Social Worker

## 2022-04-23 ENCOUNTER — Telehealth: Payer: Self-pay

## 2022-04-23 ENCOUNTER — Ambulatory Visit: Payer: Self-pay | Admitting: Gerontology

## 2022-04-23 NOTE — Telephone Encounter (Signed)
Received note regarding patient inquiring about medical records.  Attempted to follow up with patient to discuss, left message encouraging callback.

## 2022-04-28 ENCOUNTER — Ambulatory Visit: Payer: Self-pay | Admitting: Licensed Clinical Social Worker

## 2022-05-05 ENCOUNTER — Ambulatory Visit: Payer: Self-pay | Admitting: Gerontology

## 2022-05-05 ENCOUNTER — Ambulatory Visit: Payer: Self-pay | Admitting: Licensed Clinical Social Worker

## 2022-05-05 DIAGNOSIS — F339 Major depressive disorder, recurrent, unspecified: Secondary | ICD-10-CM

## 2022-05-05 DIAGNOSIS — F331 Major depressive disorder, recurrent, moderate: Secondary | ICD-10-CM

## 2022-05-05 DIAGNOSIS — F411 Generalized anxiety disorder: Secondary | ICD-10-CM

## 2022-05-05 NOTE — BH Specialist Note (Signed)
Integrated Behavioral Health Follow Up In-Person Visit  MRN: [id number] Name: Wayne Brown  Number of [place] Clinician visits: No data recorded Session Start time: No data recorded  Session End time: No data recorded Total time in minutes: No data recorded  Types of Service: Individual psychotherapy  Interpretor:No. Interpretor Name and Language: Not applicable  Subjective: Wayne Brown is a 56 y.o. male accompanied by  himself Patient was referred by Carlyon Shadow, NP for mental health. Patient reports the following symptoms/concerns: The patient reports that he has been doing okay since his last follow-up visit.  He discussed several situational stressors that are impacting his life currently.  The patient noted that he continues to struggle with frequent crying throughout his day for no apparent reason.  He noted that he is contemplating forming a spiritual meeting group at his apartment complex.  The patient discussed his spiritual and cultural views.  He shared that most people judge him based off the way he looks and he likes to surprise them by treating them with kindness.  Overall the patient stated that he is doing well despite the increased financial stress in his life.  He noted that he needed resources to help him pay for his electric bill this month.  The patient denied any suicidal or homicidal thoughts. Duration of problem: Years; Severity of problem: moderate  Objective: Mood: Euthymic and Affect: Appropriate Risk of harm to self or others: No plan to harm self or others  Life Context: Family and Social: See above School/Work: See above Self-Care: See above Life Changes: See above  Patient and/or Family's Strengths/Protective Factors: Social connections, Concrete supports in place (healthy food, safe environments, etc.), Sense of purpose, and Physical Health (exercise, healthy diet, medication compliance, etc.)  Goals  Addressed: Patient will:  Reduce symptoms of: agitation, anxiety, depression, insomnia, and stress   Increase knowledge and/or ability of: coping skills, healthy habits, self-management skills, and stress reduction   Demonstrate ability to: Increase healthy adjustment to current life circumstances  Progress towards Goals: Ongoing  Interventions: Interventions utilized:  CBT Cognitive Behavioral Therapy and Link to [employer] utilized by the clinician during today's follow up session. Clinician met with patient to identify needs related to stressors and functioning, and assess and monitor for signs and symptoms of anxiety and depression, and assess safety. The clinician processed with the patient how they have been doing since the last follow-up session.  Clinician continued to teach the patient calming relaxation and mindfulness skills and how to discriminate better between relaxation and tension and taught the client how to apply the skills in their daily life. Clinician assigned the patient homework in which they will use mindfulness skills daily gradually applying them progressively from not anxiety provoking to anxiety provoking situations and worked with the patient to resolve obstacles toward sustained implementation.  Clinician offered tand then connected Wayne Brown with Wayne Brown, Social Determinants Coordinator with the [facility]; the patient agreed.  The session ended with scheduling. Standardized Assessments completed:  Due to time constraints will assess at next follow-up.   Assessment: Patient currently experiencing see above.   Patient may benefit from see above.  Plan: Follow up with behavioral health clinician on : Thursday, May 14, 2022 at 4:00 PM Behavioral recommendations:  Referral(s): [place] (In Clinic) "From scale of 1-10, how likely are you to follow plan?":   Lesli Albee, LCSWA

## 2022-05-12 ENCOUNTER — Ambulatory Visit: Payer: Self-pay | Admitting: Gerontology

## 2022-05-14 ENCOUNTER — Encounter: Payer: Self-pay | Admitting: Gerontology

## 2022-05-14 ENCOUNTER — Ambulatory Visit: Payer: Self-pay | Admitting: Licensed Clinical Social Worker

## 2022-05-14 ENCOUNTER — Other Ambulatory Visit: Payer: Self-pay

## 2022-05-14 ENCOUNTER — Ambulatory Visit: Payer: Self-pay | Admitting: Adult Health

## 2022-05-14 VITALS — BP 136/88 | HR 106 | Temp 98.0°F | Resp 16 | Ht 76.0 in | Wt 264.0 lb

## 2022-05-14 DIAGNOSIS — F411 Generalized anxiety disorder: Secondary | ICD-10-CM

## 2022-05-14 DIAGNOSIS — I152 Hypertension secondary to endocrine disorders: Secondary | ICD-10-CM

## 2022-05-14 DIAGNOSIS — R0609 Other forms of dyspnea: Secondary | ICD-10-CM

## 2022-05-14 DIAGNOSIS — F331 Major depressive disorder, recurrent, moderate: Secondary | ICD-10-CM

## 2022-05-14 DIAGNOSIS — R600 Localized edema: Secondary | ICD-10-CM

## 2022-05-14 DIAGNOSIS — R7303 Prediabetes: Secondary | ICD-10-CM

## 2022-05-14 NOTE — Progress Notes (Signed)
Established Patient Office Visit  Subjective   Patient ID: CALBERT HULSEBUS, male    DOB: [birth year]  Age: 56 y.o. MRN: [id number]  Chief Complaint  Patient presents with   Follow-up    HPI  Wayne Brown is a 56 y/o male who has history of Allergy, Anxiety, Bipolar depression, GERD, Gout, hypertension, prediabetes, PTSD who presents for routine follow up visit. His blood pressure today in clinic is 136/88 mmHg. He states his acid reflux is well-controlled and he only takes omeprazole as needed in the mornings based on what foods he knows he will be eating that day. He endorses left-sided chest pain when he performs strenuous activity such as lifting heavy objects and walking up stairs. The pain is relieved with rest and it does not radiate. He also endorses wheezing when he lies down at night on occasion. However, he does not feel short of breath when this happens. He offers no further complaints.   Review of Systems  Constitutional: Negative.   Respiratory:  Positive for wheezing (intermittent when lying down).   Cardiovascular:  Positive for chest pain (left-sided chest pain with activity intermittently).  Gastrointestinal: Negative.   Neurological: Negative.   Psychiatric/Behavioral:  The patient has insomnia.       Objective:     BP 136/88 (BP Location: Left Arm, Patient Position: Sitting, Cuff Size: Large)   Pulse (!) 106   Temp 98 F (36.7 C) (Oral)   Resp 16   Ht '6\' 4"'  (1.93 m)   Wt 264 lb (119.7 kg)   SpO2 94%   BMI 32.14 kg/m  BP Readings from Last 3 Encounters:  05/14/22 136/88  01/22/22 133/86  10/23/21 (!) 146/83   Wt Readings from Last 3 Encounters:  05/14/22 264 lb (119.7 kg)  01/22/22 266 lb 12.8 oz (121 kg)  10/23/21 269 lb (122 kg)      Physical Exam Constitutional:      Appearance: Normal appearance.  Cardiovascular:     Rate and Rhythm: Normal rate and regular rhythm.     Pulses: Normal pulses.     Heart sounds: Normal heart sounds.   Pulmonary:     Effort: Pulmonary effort is normal.     Breath sounds: Normal breath sounds.  Musculoskeletal:     Right lower leg: 1+ Edema present.     Left lower leg: 1+ Edema present.  Skin:    General: Skin is warm and dry.  Neurological:     General: No focal deficit present.     Mental Status: He is alert and oriented to person, place, and time. Mental status is at baseline.  Psychiatric:        Mood and Affect: Mood normal.        Behavior: Behavior normal.        Thought Content: Thought content normal.        Judgment: Judgment normal.      No results found for any visits on 05/14/22.  Last CBC Lab Results  Component Value Date   WBC 7.5 10/23/2021   HGB 14.9 10/23/2021   HCT 41.4 10/23/2021   MCV 88 10/23/2021   MCH 31.5 10/23/2021   RDW 12.9 10/23/2021   PLT 231 40/34/7425   Last metabolic panel Lab Results  Component Value Date   GLUCOSE 159 (H) 10/23/2021   NA 140 10/23/2021   K 3.9 10/23/2021   CL 104 10/23/2021   CO2 22 10/23/2021   BUN 11 10/23/2021  CREATININE 1.23 10/23/2021   EGFR 70 10/23/2021   CALCIUM 9.3 10/23/2021   PROT 7.2 10/23/2021   ALBUMIN 4.2 10/23/2021   LABGLOB 3.0 10/23/2021   AGRATIO 1.4 10/23/2021   BILITOT 0.8 10/23/2021   ALKPHOS 161 (H) 10/23/2021   AST 20 10/23/2021   ALT 26 10/23/2021   ANIONGAP 6 (L) 10/06/2013   Last lipids Lab Results  Component Value Date   CHOL 144 10/23/2021   HDL 31 (L) 10/23/2021   LDLCALC 91 10/23/2021   TRIG 121 10/23/2021   CHOLHDL 4.6 10/23/2021   Last hemoglobin A1c Lab Results  Component Value Date   HGBA1C 6.0 (H) 01/22/2022   Last thyroid functions Lab Results  Component Value Date   TSH 1.850 04/27/2019      The 10-year ASCVD risk score (Arnett DK, et al., 2019) is: 6.9%    Assessment & Plan:   1. Edema of both lower legs - Referral to ambulatory cardiology. Patient can wear compression stockings and elevate legs when at rest.   2. Exertional dyspnea -  Referral to ambulatory cardiology and echo ordered. He should report to the ED for worsening symptoms.  - ECHOCARDIOGRAM COMPLETE; Future - Ambulatory referral to Cardiology  3. Prediabetes - He should continue taking his metformin as prescribed. Low-carb diet and exercise as tolerated.  - POCT HgB A1C  4. Hypertension associated with diabetes ([place]) - His blood pressure is well-controlled. DASH diet and exercise as tolerated.  - Lipid Profile - Comp Met (CMET)   Follow-up in three months, around 08/13/2022  Rayvon Char, FNP Student     [place], NP

## 2022-05-14 NOTE — Patient Instructions (Signed)
Carbohydrate Counting for Diabetes Mellitus, Adult Carbohydrate counting is a method of keeping track of how many carbohydrates you eat. Eating carbohydrates increases the amount of sugar (glucose) in the blood. Counting how many carbohydrates you eat improves how well you manage your blood glucose. This, in turn, helps you manage your diabetes. Carbohydrates are measured in grams (g) per serving. It is important to know how many carbohydrates (in grams or by serving size) you can have in each meal. This is different for every person. A dietitian can help you make a meal plan and calculate how many carbohydrates you should have at each meal and snack. What foods contain carbohydrates? Carbohydrates are found in the following foods: Grains, such as breads and cereals. Dried beans and soy products. Starchy vegetables, such as potatoes, peas, and corn. Fruit and fruit juices. Milk and yogurt. Sweets and snack foods, such as cake, cookies, candy, chips, and soft drinks. How do I count carbohydrates in foods? There are two ways to count carbohydrates in food. You can read food labels or learn standard serving sizes of foods. You can use either of these methods or a combination of both. Using the Nutrition Facts label The Nutrition Facts list is included on the labels of almost all packaged foods and beverages in the [place]. It includes: The serving size. Information about nutrients in each serving, including the grams of carbohydrate per serving. To use the Nutrition Facts, decide how many servings you will have. Then, multiply the number of servings by the number of carbohydrates per serving. The resulting number is the total grams of carbohydrates that you will be having. Learning the standard serving sizes of foods When you eat carbohydrate foods that are not packaged or do not include Nutrition Facts on the label, you need to measure the servings in order to count the grams of  carbohydrates. Measure the foods that you will eat with a food scale or measuring cup, if needed. Decide how many standard-size servings you will eat. Multiply the number of servings by 15. For foods that contain carbohydrates, one serving equals 15 g of carbohydrates. For example, if you eat 2 cups or 10 oz (300 g) of strawberries, you will have eaten 2 servings and 30 g of carbohydrates (2 servings x 15 g = 30 g). For foods that have more than one food mixed, such as soups and casseroles, you must count the carbohydrates in each food that is included. The following list contains standard serving sizes of common carbohydrate-rich foods. Each of these servings has about 15 g of carbohydrates: 1 slice of bread. 1 six-inch (15 cm) tortilla. ? cup or 2 oz (53 g) cooked rice or pasta.  cup or 3 oz (85 g) cooked or canned, drained and rinsed beans or lentils.  cup or 3 oz (85 g) starchy vegetable, such as peas, corn, or squash.  cup or 4 oz (120 g) hot cereal.  cup or 3 oz (85 g) boiled or mashed potatoes, or  or 3 oz (85 g) of a large baked potato.  cup or 4 fl oz (118 mL) fruit juice. 1 cup or 8 fl oz (237 mL) milk. 1 small or 4 oz (106 g) apple.  or 2 oz (63 g) of a medium banana. 1 cup or 5 oz (150 g) strawberries. 3 cups or 1 oz (28.3 g) popped popcorn. What is an example of carbohydrate counting? To calculate the grams of carbohydrates in this sample meal, follow the steps   shown below. Sample meal 3 oz (85 g) chicken breast. ? cup or 4 oz (106 g) brown rice.  cup or 3 oz (85 g) corn. 1 cup or 8 fl oz (237 mL) milk. 1 cup or 5 oz (150 g) strawberries with sugar-free whipped topping. Carbohydrate calculation Identify the foods that contain carbohydrates: Rice. Corn. Milk. Strawberries. Calculate how many servings you have of each food: 2 servings rice. 1 serving corn. 1 serving milk. 1 serving strawberries. Multiply each number of servings by 15 g: 2 servings rice x 15  g = 30 g. 1 serving corn x 15 g = 15 g. 1 serving milk x 15 g = 15 g. 1 serving strawberries x 15 g = 15 g. Add together all of the amounts to find the total grams of carbohydrates eaten: 30 g + 15 g + 15 g + 15 g = 75 g of carbohydrates total. What are tips for following this plan? Shopping Develop a meal plan and then make a shopping list. Buy fresh and frozen vegetables, fresh and frozen fruit, dairy, eggs, beans, lentils, and whole grains. Look at food labels. Choose foods that have more fiber and less sugar. Avoid processed foods and foods with added sugars. Meal planning Aim to have the same number of grams of carbohydrates at each meal and for each snack time. Plan to have regular, balanced meals and snacks. Where to find more information American Diabetes Association: diabetes.org Centers for Disease Control and Prevention: cdc.gov Academy of Nutrition and Dietetics: eatright.org Association of Diabetes Care & Education Specialists: diabeteseducator.org Summary Carbohydrate counting is a method of keeping track of how many carbohydrates you eat. Eating carbohydrates increases the amount of sugar (glucose) in your blood. Counting how many carbohydrates you eat improves how well you manage your blood glucose. This helps you manage your diabetes. A dietitian can help you make a meal plan and calculate how many carbohydrates you should have at each meal and snack. This information is not intended to replace advice given to you by your health care provider. Make sure you discuss any questions you have with your health care provider. Document Revised: 02/07/2020 Document Reviewed: 02/07/2020 Elsevier Patient Education  2023 Elsevier Inc. DASH Eating Plan DASH stands for Dietary Approaches to Stop Hypertension. The DASH eating plan is a healthy eating plan that has been shown to: Reduce high blood pressure (hypertension). Reduce your risk for type 2 diabetes, heart disease, and  stroke. Help with weight loss. What are tips for following this plan? Reading food labels Check food labels for the amount of salt (sodium) per serving. Choose foods with less than 5 percent of the Daily Value of sodium. Generally, foods with less than 300 milligrams (mg) of sodium per serving fit into this eating plan. To find whole grains, look for the word "whole" as the first word in the ingredient list. Shopping Buy products labeled as "low-sodium" or "no salt added." Buy fresh foods. Avoid canned foods and pre-made or frozen meals. Cooking Avoid adding salt when cooking. Use salt-free seasonings or herbs instead of table salt or sea salt. Check with your health care provider or pharmacist before using salt substitutes. Do not fry foods. Cook foods using healthy methods such as baking, boiling, grilling, roasting, and broiling instead. Cook with heart-healthy oils, such as olive, canola, avocado, soybean, or sunflower oil. Meal planning  Eat a balanced diet that includes: 4 or more servings of fruits and 4 or more servings of vegetables each day.   Try to fill one-half of your plate with fruits and vegetables. 6-8 servings of whole grains each day. Less than 6 oz (170 g) of lean meat, poultry, or fish each day. A 3-oz (85-g) serving of meat is about the same size as a deck of cards. One egg equals 1 oz (28 g). 2-3 servings of low-fat dairy each day. One serving is 1 cup (237 mL). 1 serving of nuts, seeds, or beans 5 times each week. 2-3 servings of heart-healthy fats. Healthy fats called omega-3 fatty acids are found in foods such as walnuts, flaxseeds, fortified milks, and eggs. These fats are also found in cold-water fish, such as sardines, salmon, and mackerel. Limit how much you eat of: Canned or prepackaged foods. Food that is high in trans fat, such as some fried foods. Food that is high in saturated fat, such as fatty meat. Desserts and other sweets, sugary drinks, and other foods  with added sugar. Full-fat dairy products. Do not salt foods before eating. Do not eat more than 4 egg yolks a week. Try to eat at least 2 vegetarian meals a week. Eat more home-cooked food and less restaurant, buffet, and fast food. Lifestyle When eating at a restaurant, ask that your food be prepared with less salt or no salt, if possible. If you drink alcohol: Limit how much you use to: 0-1 drink a day for women who are not pregnant. 0-2 drinks a day for men. Be aware of how much alcohol is in your drink. In the [place] one drink equals one 12 oz bottle of beer (355 mL), one 5 oz glass of wine (148 mL), or one 1 oz glass of hard liquor (44 mL). General information Avoid eating more than 2,300 mg of salt a day. If you have hypertension, you may need to reduce your sodium intake to 1,500 mg a day. Work with your health care provider to maintain a healthy body weight or to lose weight. Ask what an ideal weight is for you. Get at least 30 minutes of exercise that causes your heart to beat faster (aerobic exercise) most days of the week. Activities may include walking, swimming, or biking. Work with your health care provider or dietitian to adjust your eating plan to your individual calorie needs. What foods should I eat? Fruits All fresh, dried, or frozen fruit. Canned fruit in natural juice (without added sugar). Vegetables Fresh or frozen vegetables (raw, steamed, roasted, or grilled). Low-sodium or reduced-sodium tomato and vegetable juice. Low-sodium or reduced-sodium tomato sauce and tomato paste. Low-sodium or reduced-sodium canned vegetables. Grains Whole-grain or whole-wheat bread. Whole-grain or whole-wheat pasta. Brown rice. Oatmeal. Quinoa. Bulgur. Whole-grain and low-sodium cereals. Pita bread. Low-fat, low-sodium crackers. Whole-wheat flour tortillas. Meats and other proteins Skinless chicken or turkey. Ground chicken or turkey. Pork with fat trimmed off. Fish and seafood. Egg  whites. Dried beans, peas, or lentils. Unsalted nuts, nut butters, and seeds. Unsalted canned beans. Lean cuts of beef with fat trimmed off. Low-sodium, lean precooked or cured meat, such as sausages or meat loaves. Dairy Low-fat (1%) or fat-free (skim) milk. Reduced-fat, low-fat, or fat-free cheeses. Nonfat, low-sodium ricotta or cottage cheese. Low-fat or nonfat yogurt. Low-fat, low-sodium cheese. Fats and oils Soft margarine without trans fats. Vegetable oil. Reduced-fat, low-fat, or light mayonnaise and salad dressings (reduced-sodium). Canola, safflower, olive, avocado, soybean, and sunflower oils. Avocado. Seasonings and condiments Herbs. Spices. Seasoning mixes without salt. Other foods Unsalted popcorn and pretzels. Fat-free sweets. The items listed above may not be   a complete list of foods and beverages you can eat. Contact a dietitian for more information. What foods should I avoid? Fruits Canned fruit in a light or heavy syrup. Fried fruit. Fruit in cream or butter sauce. Vegetables Creamed or fried vegetables. Vegetables in a cheese sauce. Regular canned vegetables (not low-sodium or reduced-sodium). Regular canned tomato sauce and paste (not low-sodium or reduced-sodium). Regular tomato and vegetable juice (not low-sodium or reduced-sodium). Pickles. Olives. Grains Baked goods made with fat, such as croissants, muffins, or some breads. Dry pasta or rice meal packs. Meats and other proteins Fatty cuts of meat. Ribs. Fried meat. Bacon. Bologna, salami, and other precooked or cured meats, such as sausages or meat loaves. Fat from the back of a pig (fatback). Bratwurst. Salted nuts and seeds. Canned beans with added salt. Canned or smoked fish. Whole eggs or egg yolks. Chicken or turkey with skin. Dairy Whole or 2% milk, cream, and half-and-half. Whole or full-fat cream cheese. Whole-fat or sweetened yogurt. Full-fat cheese. Nondairy creamers. Whipped toppings. Processed cheese and  cheese spreads. Fats and oils Butter. Stick margarine. Lard. Shortening. Ghee. Bacon fat. Tropical oils, such as coconut, palm kernel, or palm oil. Seasonings and condiments Onion salt, garlic salt, seasoned salt, table salt, and sea salt. Worcestershire sauce. Tartar sauce. Barbecue sauce. Teriyaki sauce. Soy sauce, including reduced-sodium. Steak sauce. Canned and packaged gravies. Fish sauce. Oyster sauce. Cocktail sauce. Store-bought horseradish. Ketchup. Mustard. Meat flavorings and tenderizers. Bouillon cubes. Hot sauces. Pre-made or packaged marinades. Pre-made or packaged taco seasonings. Relishes. Regular salad dressings. Other foods Salted popcorn and pretzels. The items listed above may not be a complete list of foods and beverages you should avoid. Contact a dietitian for more information. Where to find more information National Heart, Lung, and Blood Institute: www.nhlbi.nih.gov American Heart Association: www.heart.org Academy of Nutrition and Dietetics: www.eatright.org National Kidney Foundation: www.kidney.org Summary The DASH eating plan is a healthy eating plan that has been shown to reduce high blood pressure (hypertension). It may also reduce your risk for type 2 diabetes, heart disease, and stroke. When on the DASH eating plan, aim to eat more fresh fruits and vegetables, whole grains, lean proteins, low-fat dairy, and heart-healthy fats. With the DASH eating plan, you should limit salt (sodium) intake to 2,300 mg a day. If you have hypertension, you may need to reduce your sodium intake to 1,500 mg a day. Work with your health care provider or dietitian to adjust your eating plan to your individual calorie needs. This information is not intended to replace advice given to you by your health care provider. Make sure you discuss any questions you have with your health care provider. Document Revised: 06/09/2019 Document Reviewed: 06/09/2019 Elsevier Patient Education  2023  Elsevier Inc.  

## 2022-05-14 NOTE — BH Specialist Note (Signed)
Integrated Behavioral Health Follow Up In-Person Visit  MRN: [id number] Name: Wayne Brown  Number of [facility] Clinician visits: No data recorded Session Start time: No data recorded  Session End time: No data recorded Total time in minutes: No data recorded  Types of Service: Individual psychotherapy  Interpretor:No. Interpretor Name and Language: N/A  Subjective: Wayne Brown is a 56 y.o. male accompanied by  himself Patient was referred by Wayne Shadow, NP for mental health. Patient reports the following symptoms/concerns: The patient reports that he has been doing about the same since his last follow-up appointment. He discussed familial and other situational stressors impacting his life currently. He described having feelings of regret regarding bringing up his children and shared that he has since apologized to them. The patient discussed struggling with loud neighbors but has been coping by leaving the room and taking care of his cat. Wayne Brown shared that his cat helps him to calm down by being near to him and when he pets his cat he feels tension start to leave his body as he relaxes. The patient discussed interpersonal relationships and stated he plans to talk to his girlfriend about being more public about their relationship. The patient denied any suicidal or homicidal thoughts.  Duration of problem: Years; Severity of problem: moderate  Objective: Mood: Euthymic and Affect: Blunt Risk of harm to self or others: No plan to harm self or others  Life Context: Family and Social: see above School/Work: see above Self-Care: see above Life Changes: see above  Patient and/or Family's Strengths/Protective Factors: Concrete supports in place (healthy food, safe environments, etc.), Sense of purpose, and Physical Health (exercise, healthy diet, medication compliance, etc.)  Goals Addressed: Patient will:  Reduce symptoms of: agitation, anxiety,  depression, insomnia, and stress   Increase knowledge and/or ability of: coping skills, healthy habits, self-management skills, and stress reduction   Demonstrate ability to: Increase healthy adjustment to current life circumstances  Progress towards Goals: Ongoing  Interventions: Interventions utilized:  CBT Cognitive Behavioral Therapywas utilized by the clinician during today's follow up session. Clinician met with patient to identify needs related to stressors and functioning, and assess and monitor for signs and symptoms of anxiety and depression, and assess safety. The clinician processed with the patient how they have been doing since the last follow-up session.Clinician measured the patient's anxiety and depression on a numerical scale. Clinician assisted the patient to identify and challenge negative thoughts and patterns. Clinician helped the patient re-frame distorted thoughts to promote a more balanced perspective. Clinician encouraged the client to keep a thought log to share during his next visit. The session ended with scheduling.  Standardized Assessments completed: GAD-7 and PHQ 9 GAD-7=13 PHQ-9=14   Assessment: Patient currently experiencing see above.   Patient may benefit from see above.  Plan: Follow up with behavioral health clinician on : 05/27/2022 at noon. Behavioral recommendations:  Referral(s): [facility] (In Clinic) "From scale of 1-10, how likely are you to follow plan?":   Wayne Brown, LCSWA

## 2022-05-16 LAB — LIPID PANEL
Chol/HDL Ratio: 4.4 ratio (ref 0.0–5.0)
Cholesterol, Total: 153 mg/dL (ref 100–199)
HDL: 35 mg/dL — ABNORMAL LOW (ref >39)
LDL Chol Calc (NIH): 98 mg/dL (ref 0–99)
Triglycerides: 106 mg/dL (ref 0–149)
VLDL Cholesterol Cal: 20 mg/dL (ref 5–40)

## 2022-05-16 LAB — COMPREHENSIVE METABOLIC PANEL
ALT: 35 IU/L (ref 0–44)
AST: 24 IU/L (ref 0–40)
Albumin/Globulin Ratio: 1.8 (ref 1.2–2.2)
Albumin: 4.4 g/dL (ref 3.8–4.9)
Alkaline Phosphatase: 123 IU/L — ABNORMAL HIGH (ref 44–121)
BUN/Creatinine Ratio: 7 — ABNORMAL LOW (ref 9–20)
BUN: 8 mg/dL (ref 6–24)
Bilirubin Total: 0.6 mg/dL (ref 0.0–1.2)
CO2: 22 mmol/L (ref 20–29)
Calcium: 9.1 mg/dL (ref 8.7–10.2)
Chloride: 105 mmol/L (ref 96–106)
Creatinine, Ser: 1.12 mg/dL (ref 0.76–1.27)
Globulin, Total: 2.5 g/dL (ref 1.5–4.5)
Glucose: 156 mg/dL — ABNORMAL HIGH (ref 70–99)
Potassium: 3.7 mmol/L (ref 3.5–5.2)
Sodium: 141 mmol/L (ref 134–144)
Total Protein: 6.9 g/dL (ref 6.0–8.5)
eGFR: 78 mL/min/{1.73_m2} (ref >59)

## 2022-05-25 ENCOUNTER — Other Ambulatory Visit: Payer: Self-pay

## 2022-05-25 ENCOUNTER — Other Ambulatory Visit: Payer: Self-pay | Admitting: Gerontology

## 2022-05-25 DIAGNOSIS — K219 Gastro-esophageal reflux disease without esophagitis: Secondary | ICD-10-CM

## 2022-05-25 MED FILL — Cyanocobalamin Tab 500 MCG: ORAL | 30 days supply | Qty: 30 | Fill #2 | Status: AC

## 2022-05-26 ENCOUNTER — Other Ambulatory Visit: Payer: Self-pay

## 2022-05-26 MED FILL — Omeprazole Cap Delayed Release 20 MG: ORAL | 90 days supply | Qty: 90 | Fill #0 | Status: AC

## 2022-05-27 ENCOUNTER — Ambulatory Visit: Payer: Self-pay | Admitting: Licensed Clinical Social Worker

## 2022-05-27 DIAGNOSIS — F339 Major depressive disorder, recurrent, unspecified: Secondary | ICD-10-CM

## 2022-05-27 DIAGNOSIS — F411 Generalized anxiety disorder: Secondary | ICD-10-CM

## 2022-05-27 NOTE — BH Specialist Note (Signed)
Integrated Behavioral Health Follow Up In-Person Visit  MRN: [id number] Name: Wayne Brown  Number of [facility] Clinician visits: No data recorded Session Start time: No data recorded  Session End time: No data recorded Total time in minutes: No data recorded  Types of Service: Individual psychotherapy  Interpretor:No. Interpretor Name and Language: N/A  Subjective: Wayne Brown is a 56 y.o. male accompanied by  himself Patient was referred by Carlyon Shadow, NP for mental health. Patient reports the following symptoms/concerns: The patient reports that he has been doing about the same since his last follow-up appointment.  Wayne Brown discussed financial and health stressors that are impacting his life currently.  He stated that he spoke to his girlfriend this morning and realized he was not sure if it was even a real relationship since they just texted each other.  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  He shared that he felt like his girlfriend needed to be with someone who could support her financially.  The patient discussed other familial stressors impacting his life.  He noted that he had a prescription for glasses and [facility]  The patient stated that writing things in his notebook helped him to get them out of his head.  Wayne Brown denied any suicidal or homicidal thoughts. Duration of problem: Years; Severity of problem: moderate  Objective: Mood: Depressed and Affect: Appropriate Risk of harm to self or others: No plan to harm self or others  Life Context: Family and Social: See above School/Work: See above Self-Care: See above Life Changes: See above  Patient and/or  Family's Strengths/Protective Factors: Social connections, Concrete supports in place (healthy food, safe environments, etc.), Sense of purpose, and Physical Health (exercise, healthy diet, medication compliance, etc.)  Goals Addressed: Patient will:  Reduce symptoms of: agitation, anxiety, compulsions, depression, insomnia, and stress   Increase knowledge and/or ability of: coping skills, healthy habits, self-management skills, and stress reduction   Demonstrate ability to: Increase healthy adjustment to current life circumstances  Progress towards Goals: Ongoing  Interventions: Interventions utilized:  CBT Cognitive Behavioral Therapywas utilized by the clinician during today's follow up session. Clinician met with patient to identify needs related to stressors and functioning, and assess and monitor for signs and symptoms of anxiety and depression, and assess safety. The clinician processed with the patient how they have been doing since the last follow-up session.  Clinician measured the patient's anxiety and depression on a numerical scale. Clinician continued to teach the patient calming relaxation and mindfulness skills and how to discriminate better between relaxation and tension and taught the client how to apply the skills in their daily life. Clinician assigned the patient homework in which they will use mindfulness skills daily gradually applying them progressively from not anxiety provoking to anxiety provoking situations and worked with the patient to resolve obstacles toward sustained implementation. The session ended with scheduling.  Standardized Assessments completed: Fad-7,PHQ-9, PID-5 GAD-7=13 PHQ-9=09 PID -5    Assessment: Patient currently experiencing see above.   Patient may benefit from see above.  Plan: Follow up with behavioral health clinician on : June 17, 2022 at 2:00 PM. Behavioral recommendations:  Referral(s): [facility] (In  Clinic) "From scale of 1-10, how likely are you to follow plan?":   Lesli Albee, LCSWA

## 2022-05-29 ENCOUNTER — Other Ambulatory Visit: Payer: Self-pay

## 2022-06-03 ENCOUNTER — Ambulatory Visit: Payer: Self-pay | Admitting: Licensed Clinical Social Worker

## 2022-06-03 DIAGNOSIS — F411 Generalized anxiety disorder: Secondary | ICD-10-CM

## 2022-06-03 DIAGNOSIS — F339 Major depressive disorder, recurrent, unspecified: Secondary | ICD-10-CM

## 2022-06-03 NOTE — BH Specialist Note (Signed)
Integrated Behavioral Health Follow Up In-Person Visit  MRN: [id number] Name: Wayne Brown  Number of [place] Clinician visits: No data recorded Session Start time: No data recorded  Session End time: No data recorded Total time in minutes: No data recorded  Types of Service: Individual psychotherapy  Interpretor:No. Interpretor Name and Language: N/A  Subjective: Wayne Brown is a 56 y.o. male accompanied by  himself Patient was referred by Carlyon Shadow, NP for mental health. Patient reports the following symptoms/concerns: The patient reports that he has been doing the same  since his last follow up appointment. He discussed continuing financial and increasing situational stressors impacting his life. He shared that he has a very strict morning routine that he needs to follow before he can start his day. He shared that he continues to feel drawn to make a video about a message he has received from God, but he is finding it difficult due to feelings of potential judgement. The patient discussed feeling judged by others based off of his tattoos and  the way he dresses. He shared that he picks up on messages that God is sending him in things other people may say to him or things he may read on social media or see while he is in the community. The patient discussed his relationship with other pastors and feeling judged by them. He explained that he has been coping with feeling judged  by using positive self talk and journaling. The patient denied any suicidal or homicidal thoughts.  Duration of problem: Years; Severity of problem: moderate  Objective: Mood: Euthymic and Affect: Appropriate Risk of harm to self or others: No plan to harm self or others  Life Context: Family and Social: see above School/Work: see above Self-Care: see above Life Changes: see above  Patient and/or Family's Strengths/Protective Factors: Social connections, Concrete supports  in place (healthy food, safe environments, etc.), Sense of purpose, and Physical Health (exercise, healthy diet, medication compliance, etc.)  Goals Addressed: Patient will:  Reduce symptoms of: agitation, anxiety, depression, insomnia, and stress   Increase knowledge and/or ability of: coping skills, healthy habits, self-management skills, and stress reduction   Demonstrate ability to: Increase healthy adjustment to current life circumstances  Progress towards Goals: Ongoing  Interventions: Interventions utilized:  CBT Cognitive Behavioral Therapywas utilized by the clinician during today's follow up session. Clinician met with patient to identify needs related to stressors and functioning, and assess and monitor for signs and symptoms of anxiety and depression, and assess safety. The clinician processed with the patient how they have been doing since the last follow-up session. Clinician continued to teach the patient calming relaxation and mindfulness skills and how to discriminate better between relaxation and tension and taught the client how to apply the skills in their daily life. The clinician used Psychoeducation to explain Cognitive distortions to the patient and partnered with the patient to identify and challenge his negative thoughts. Clinician encouraged the patient to write down three examples of cognitive distortions and bring it to his follow-up session. Session ended with scheduling. .  Standardized Assessments completed:  Due to time constraints will complete and follow up.     Assessment: Patient currently experiencing see above.   Patient may benefit from see above .  Plan: Follow up with behavioral health clinician on : 06/17/2022 at 2:00 PM  Behavioral recommendations:  Referral(s): [place] (In Clinic) "From scale of 1-10, how likely are you to follow plan?":  Lesli Albee, LCSWA

## 2022-06-17 ENCOUNTER — Ambulatory Visit: Payer: Self-pay | Admitting: Licensed Clinical Social Worker

## 2022-06-17 DIAGNOSIS — F339 Major depressive disorder, recurrent, unspecified: Secondary | ICD-10-CM

## 2022-06-17 DIAGNOSIS — F411 Generalized anxiety disorder: Secondary | ICD-10-CM

## 2022-06-17 NOTE — BH Specialist Note (Signed)
Integrated Behavioral Health Follow Up In-Person Visit  MRN: [id number] Name: Wayne Brown  Number of [place] Clinician visits: No data recorded Session Start time: No data recorded  Session End time: No data recorded Total time in minutes: No data recorded  Types of Service: Individual psychotherapy  Interpretor:No. Interpretor Name and Language: N/A  Subjective: Wayne Brown is a 56 y.o. male accompanied by  himself  Patient was referred by Carlyon Shadow, NP for mental health. Patient reports the following symptoms/concerns: The patient reports that he has been doing okay since his last follow up appointment. He noted that he continues to struggle with family and financial stressors impacting his life currently. He noted that he has been experiencing a lot of intrusive thoughts. He shared that he often has these thoughts that someone is breaking into him home, are going to hurt his cat, or someone he loves. He noted that he has a difficult time falling asleep until it is almost daylight,. He shared that when he experiences these thoughts he has to check behind doors and in his closet even though he knows it is only a thought. The patient shared that he thinks other people find it weird that he has  cat because he dresses like a biker and is a big guy. He noted that he feels like other people look down on him because he cares so much for animals. He shared that his cat provides a lot of comfort and support in his life. Wayne Brown denied any suicidal or homicidal thoughts.  Duration of problem: Years; Severity of problem: moderate  Objective: Mood: Euthymic and Affect: Blunt Risk of harm to self or others: No plan to harm self or others  Life Context: Family and Social: see above School/Work: see above Self-Care: see above Life Changes: see above  Patient and/or Family's Strengths/Protective Factors: Social connections, Concrete supports in place (healthy  food, safe environments, etc.), Sense of purpose, and Physical Health (exercise, healthy diet, medication compliance, etc.)  Goals Addressed: Patient will:  Reduce symptoms of: agitation, anxiety, compulsions, depression, insomnia, and stress   Increase knowledge and/or ability of: coping skills, healthy habits, self-management skills, and stress reduction   Demonstrate ability to: Increase healthy adjustment to current life circumstances  Progress towards Goals: Ongoing  Interventions: Interventions utilized:  CBT Cognitive Behavioral Therapywas utilized by the clinician during today's follow up session. Clinician met with patient to identify needs related to stressors and functioning, and assess and monitor for signs and symptoms of anxiety and depression, and assess safety. The clinician processed with the patient how they have been doing since the last follow-up session. Clinician measured the patient's anxiety and depression on a numerical scale. Clinician continued to work with the patient to identify his negative thoughts that contribute to his distress and negative emotions. Clinician encouraged the patient to challenge these negative thoughts and examine the evidence that supports and refutes them, Clinician assisted the patient in identifying that cognitive distortions such as all or nothing thinking and guided him in re-framing these distortions. Clinician introduced thought stopping techniques and encouraged the patient to practice these this week. The session ended with scheduling.  Standardized Assessments completed: GAD-7 and PHQ 9 GAD-7=10 PHQ-9=11  Assessment: Patient currently experiencing  see above.   Patient may benefit from see above.  Plan: Follow up with behavioral health clinician on : 06/24/2022 at 2:00 PM Behavioral recommendations:  Referral(s): [place] (In Clinic) "From scale of 1-10, how likely  are you to follow plan?":   Lesli Albee, LCSWA

## 2022-06-24 ENCOUNTER — Ambulatory Visit: Payer: Self-pay | Admitting: Licensed Clinical Social Worker

## 2022-06-25 MED FILL — Cyanocobalamin Tab 500 MCG: ORAL | 30 days supply | Qty: 30 | Fill #3 | Status: CN

## 2022-06-26 ENCOUNTER — Other Ambulatory Visit: Payer: Self-pay

## 2022-06-30 ENCOUNTER — Ambulatory Visit: Payer: Self-pay

## 2022-07-01 ENCOUNTER — Ambulatory Visit: Payer: Self-pay | Admitting: Licensed Clinical Social Worker

## 2022-07-01 DIAGNOSIS — F411 Generalized anxiety disorder: Secondary | ICD-10-CM

## 2022-07-01 DIAGNOSIS — F339 Major depressive disorder, recurrent, unspecified: Secondary | ICD-10-CM

## 2022-07-01 NOTE — BH Specialist Note (Signed)
Integrated Behavioral Health Follow Up In-Person Visit  MRN: [id number] Name: Wayne Brown  Number of [place] Clinician visits: No data recorded Session Start time: No data recorded  Session End time: No data recorded Total time in minutes: No data recorded  Types of Service: Individual psychotherapy  Interpretor:No. Interpretor Name and Language: N/A  Subjective: Wayne Brown is a 56 y.o. male accompanied by  himself  Patient was referred by Carlyon Shadow, NP for mental Health. Patient reports the following symptoms/concerns: The patient reports that he has been doing the same since his last follow-up appointment. The patient discussed continuing situational stressors impacting his life currently. He shared his experiences with previous holidays and shared that while he neither likes or dislikes the holidays he finds them lonely. 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 He shared that he feels he needed to take a step back and take a break from it. He shared that he is struggling with his relationship and wants to move forward but his partner wants to keep the relationship  secret. He noted that he gets upset easily and posts messages on social media instead of saying them to avoid confrontations.  The patient denied any suicidal or homicidal thoughts.  Duration of problem: Years; Severity of problem: moderate  Objective: Mood: Euthymic and Affect: Appropriate Risk of harm to self or others: No plan to harm self or others  Life Context: Family and Social: see above School/Work: see above Self-Care: see above Life Changes: see above  Patient and/or Family's Strengths/Protective Factors: Social connections, Concrete supports in place (healthy food, safe environments, etc.), Sense of purpose, and Physical Health (exercise, healthy diet, medication compliance, etc.)  Goals Addressed: Patient will:  Reduce  symptoms of: agitation, anxiety, depression, insomnia, and stress   Increase knowledge and/or ability of: coping skills, healthy habits, self-management skills, and stress reduction   Demonstrate ability to: Increase healthy adjustment to current life circumstances  Progress towards Goals: Ongoing  Interventions: Interventions utilized:  CBT Cognitive Behavioral Therapywas utilized by the clinician during today's follow up session. Clinician met with patient to identify needs related to stressors and functioning, and assess and monitor for signs and symptoms of anxiety and depression, and assess safety. The clinician processed with the patient how they have been doing since the last follow-up session. The clinician encouraged the patient to utilize their coping skills to deal with their current life circumstances. The session ended with scheduling    Assessment: Patient currently experiencing see above.   Patient may benefit from see above.  Plan: Follow up with behavioral health clinician on : 07/22/2021 at 2:00 PM  Behavioral recommendations:  Referral(s): [place] (In Clinic) "From scale of 1-10, how likely are you to follow plan?":   Lesli Albee, LCSWA

## 2022-07-03 ENCOUNTER — Other Ambulatory Visit: Payer: Self-pay

## 2022-07-06 ENCOUNTER — Other Ambulatory Visit: Payer: Self-pay

## 2022-07-06 MED FILL — Cyanocobalamin Tab 500 MCG: ORAL | 30 days supply | Qty: 30 | Fill #3 | Status: AC

## 2022-07-22 ENCOUNTER — Ambulatory Visit: Payer: Self-pay | Admitting: Licensed Clinical Social Worker

## 2022-07-29 ENCOUNTER — Ambulatory Visit: Payer: Self-pay | Admitting: Licensed Clinical Social Worker

## 2022-08-05 ENCOUNTER — Ambulatory Visit: Payer: Self-pay | Admitting: Licensed Clinical Social Worker

## 2022-08-05 DIAGNOSIS — F411 Generalized anxiety disorder: Secondary | ICD-10-CM

## 2022-08-05 DIAGNOSIS — F339 Major depressive disorder, recurrent, unspecified: Secondary | ICD-10-CM
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
## 2022-08-12 ENCOUNTER — Ambulatory Visit: Payer: Self-pay | Admitting: Licensed Clinical Social Worker

## 2022-08-12 ENCOUNTER — Ambulatory Visit: Payer: Medicaid Other | Admitting: Licensed Clinical Social Worker

## 2022-08-12 DIAGNOSIS — F411 Generalized anxiety disorder: Secondary | ICD-10-CM

## 2022-08-12 DIAGNOSIS — F339 Major depressive disorder, recurrent, unspecified: Secondary | ICD-10-CM
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
## 2022-08-13 ENCOUNTER — Ambulatory Visit: Payer: Self-pay | Admitting: Gerontology

## 2022-08-20 ENCOUNTER — Other Ambulatory Visit: Payer: Self-pay

## 2022-08-20 ENCOUNTER — Ambulatory Visit: Payer: Self-pay | Admitting: Licensed Clinical Social Worker

## 2022-08-20 ENCOUNTER — Ambulatory Visit: Payer: Medicaid Other | Admitting: Adult Health

## 2022-08-20 ENCOUNTER — Encounter: Payer: Self-pay | Admitting: Gerontology

## 2022-08-20 VITALS — BP 160/95 | HR 82 | Temp 97.9°F | Resp 16 | Ht 76.0 in | Wt 258.6 lb

## 2022-08-20 DIAGNOSIS — F4310 Post-traumatic stress disorder, unspecified: Secondary | ICD-10-CM

## 2022-08-20 DIAGNOSIS — R7303 Prediabetes: Secondary | ICD-10-CM

## 2022-08-20 DIAGNOSIS — I10 Essential (primary) hypertension: Secondary | ICD-10-CM

## 2022-08-20 DIAGNOSIS — K219 Gastro-esophageal reflux disease without esophagitis: Secondary | ICD-10-CM

## 2022-08-20 DIAGNOSIS — E538 Deficiency of other specified B group vitamins: Secondary | ICD-10-CM

## 2022-08-20 LAB — POCT GLYCOSYLATED HEMOGLOBIN (HGB A1C): Hemoglobin A1C: 5.9 % — AB (ref 4.0–5.6)

## 2022-08-20 LAB — GLUCOSE, POCT (MANUAL RESULT ENTRY): POC Glucose: 130 mg/dl — AB (ref 70–99)

## 2022-08-20 MED ORDER — BLOOD GLUCOSE MONITOR SYSTEM W/DEVICE KIT
1.0000 | PACK | Freq: Three times a day (TID) | 0 refills | Status: DC
  Filled 2022-08-20: qty 1, 1d supply, fill #0

## 2022-08-20 MED ORDER — LANCET DEVICE MISC
1.0000 | Freq: Three times a day (TID) | 0 refills | Status: AC
  Filled 2022-08-20: qty 1, 30d supply, fill #0

## 2022-08-20 MED ORDER — LISINOPRIL 10 MG PO TABS
10.0000 mg | ORAL_TABLET | Freq: Every day | ORAL | 1 refills | Status: DC
  Filled 2022-08-20: qty 90, 90d supply, fill #0
  Filled 2023-06-06: qty 90, 90d supply, fill #1

## 2022-08-20 MED ORDER — OMEPRAZOLE 20 MG PO CPDR
20.0000 mg | DELAYED_RELEASE_CAPSULE | Freq: Every day | ORAL | 0 refills | Status: DC
  Filled 2022-08-20: qty 90, 90d supply, fill #0

## 2022-08-20 MED ORDER — GLUCOSE BLOOD VI STRP
1.0000 | ORAL_STRIP | Freq: Three times a day (TID) | 0 refills | Status: AC
  Filled 2022-08-20: qty 100, 34d supply, fill #0

## 2022-08-20 MED ORDER — VITAMIN B-12 500 MCG PO TABS
ORAL_TABLET | ORAL | 3 refills | Status: DC
  Filled 2022-08-20: qty 30, fill #0
  Filled 2022-08-22: qty 100, 100d supply, fill #0

## 2022-08-20 MED ORDER — ACCU-CHEK SOFTCLIX LANCETS MISC
1.0000 | Freq: Three times a day (TID) | 0 refills | Status: AC
  Filled 2022-08-20: qty 100, 30d supply, fill #0

## 2022-08-20 NOTE — Progress Notes (Signed)
Patient: Wayne Brown Male    DOB: 1966-07-13   57 y.o.   MRN: [id number] Visit Date: 08/20/2022  Today's Provider: Deforest Hoyles, NP   Chief Complaint  Patient presents with   Follow-up    Patient has not taken any medications in ~ 3 weeks   Hypertension    Patient hasn't taken HTN meds in ~ 3 weeks   Prediabetes    Patient hasn't taken Metformin in ~ 3 weeks   Subjective:    HPI Wayne Brown is a 57 year old who present today for follow up visit.  Patient states he is feeling alright today. He has  sick about 3 weeks and  lost his routine so he has not been taking his medications as he is suppose to . He states he feels better since stopped his meds especially the metformin.  He thinks the Metformin was making him feel tired and chest pains which he has not fel since he stopped it. He is currently not taking any of pysch meds. He said counselling and other mearsures/tools have been helping him. 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 Has a lot of stressors he has been dealing. States that sometimes his depression gets to him and he has no motivation.  Blood pressure machine is  broken so he is not checking his BP at home.  Takes his acid reflux medication only as needed and depends on what he will be eating .  Never checked blood sugars at home and he does not have a glucometer  Denies headache, changes in vision , nausea, vomiting, no pain or numbness or tingling. No erectile dysfunction.   Allergies  Allergen Reactions   Erythromycin Other (See Comments)    Depression per pt; occurred as a child   Tramadol Nausea And Vomiting    Headaches and nausea   Lithium Rash    "lithium rash" per pt   Penicillins Rash    Per pt rxn was an infant, familial; rash with welts   Toradol [Ketorolac Tromethamine] Nausea And Vomiting and Rash   Vicodin [Hydrocodone-Acetaminophen] Nausea And Vomiting and Rash     Skin cold and vomiting   Previous Medications   LISINOPRIL (ZESTRIL) 10 MG TABLET    Take 1 tablet (10 mg total) by mouth once daily.   METFORMIN (GLUCOPHAGE) 500 MG TABLET    Take 1 tablet (500 mg total) by mouth daily with breakfast.   OMEPRAZOLE (PRILOSEC) 20 MG CAPSULE    Take 1 capsule (20 mg total) by mouth daily.   VITAMIN B-12 (CYANOCOBALAMIN) 500 MCG TABLET    Take 1 tablet (500 mcg total) by mouth once daily.    Review of Systems  Constitutional:  Negative for activity change, appetite change, chills, diaphoresis, fatigue, fever and unexpected weight change.  Eyes:  Negative for photophobia, pain, discharge, redness, itching and visual disturbance.  Respiratory:  Negative for cough, chest tightness and shortness of breath.   Cardiovascular:  Negative for chest pain, palpitations and leg swelling.  Skin:  Negative for color change, pallor, rash and wound.  Psychiatric/Behavioral:  Negative for agitation and behavioral problems. The patient is not nervous/anxious.     Social History   Tobacco Use   Smoking status: Never   Smokeless tobacco: Never  Substance Use Topics   Alcohol use: Yes    Comment: 1-2 drinks less than monthly(every 6 months), last use 07/20/2022  Objective:   BP (!) 160/95 (BP Location: Left Arm, Patient Position: Sitting, Cuff Size: Large)   Pulse 82   Temp 97.9 F (36.6 C) (Oral)   Resp 16   Ht 6\' 4"  (1.93 m)   Wt 258 lb 9.6 oz (117.3 kg)   SpO2 93%   BMI 31.48 kg/m   Physical Exam Constitutional:      Appearance: Normal appearance.  HENT:     Head: Normocephalic.     Mouth/Throat:     Mouth: Mucous membranes are moist.     Pharynx: Oropharynx is clear.  Eyes:     Conjunctiva/sclera: Conjunctivae normal.  Cardiovascular:     Rate and Rhythm: Normal rate and regular rhythm.  Pulmonary:     Effort: Pulmonary effort is normal. No respiratory distress.     Breath sounds: Normal breath sounds.  Chest:     Chest wall: No tenderness.   Skin:    General: Skin is warm and dry.  Neurological:     Mental Status: He is alert. Mental status is at baseline.  Psychiatric:        Mood and Affect: Mood normal.        Behavior: Behavior normal.        Thought Content: Thought content normal.        Judgment: Judgment normal.         Assessment & Plan:  1. Prediabetes  Metformin was stopped since he has not been taking it and Hgb A1c level is 5.9 Glucometer was given today for  blood sugar checks  and keep log of readings.He is now eligible for medicare so patient is advised to follow up his provider and A1c goes above 6.5, consider low dose glipizide.  - POCT Glucose (CBG) - POCT HgB A1C  2. Gastroesophageal reflux disease without esophagitis Continue current medications  - omeprazole (PRILOSEC) 20 MG capsule; Take 1 capsule (20 mg total) by mouth daily.  Dispense: 90 capsule; Refill: 0  3. Essential hypertension Patient advised to resume taking blood pressure medication as prescribed. Blood pressure machine given today. Check blood pressure daily and keep logs - lisinopril (ZESTRIL) 10 MG tablet; Take 1 tablet (10 mg total) by mouth daily.  Dispense: 90 tablet; Refill: 1  4. Vitamin B 12 deficiency Continue current medications - vitamin B-12 (CYANOCOBALAMIN) 500 MCG tablet; Take 1 tablet (500 mcg total) by mouth once daily.  Dispense: 30 tablet; Refill: 3  5. PTSD (post-traumatic stress disorder) Continue follow up with counselor.   Deforest Hoyles, NP   [facility]

## 2022-08-20 NOTE — Progress Notes (Deleted)
   Established Patient Office Visit  Subjective   Patient ID: AMMIEL GUINEY, male    DOB: 08-05-1965  Age: 57 y.o. MRN: [id number]  No chief complaint on file.   HPI  IYAN FLETT is a 57 y/o male who has history of Allergy, Anxiety, Bipolar depression, GERD, Gout, hypertension, prediabetes, PTSD who presents for routine follow up visit.   ROS    Objective:     There were no vitals taken for this visit. BP Readings from Last 3 Encounters:  05/14/22 136/88  01/22/22 133/86  10/23/21 (!) 146/83   Wt Readings from Last 3 Encounters:  05/14/22 264 lb (119.7 kg)  01/22/22 266 lb 12.8 oz (121 kg)  10/23/21 269 lb (122 kg)      Physical Exam   No results found for any visits on 08/20/22.  Last CBC Lab Results  Component Value Date   WBC 7.5 10/23/2021   HGB 14.9 10/23/2021   HCT 41.4 10/23/2021   MCV 88 10/23/2021   MCH 31.5 10/23/2021   RDW 12.9 10/23/2021   PLT 231 86/76/7209   Last metabolic panel Lab Results  Component Value Date   GLUCOSE 156 (H) 05/14/2022   NA 141 05/14/2022   K 3.7 05/14/2022   CL 105 05/14/2022   CO2 22 05/14/2022   BUN 8 05/14/2022   CREATININE 1.12 05/14/2022   EGFR 78 05/14/2022   CALCIUM 9.1 05/14/2022   PROT 6.9 05/14/2022   ALBUMIN 4.4 05/14/2022   LABGLOB 2.5 05/14/2022   AGRATIO 1.8 05/14/2022   BILITOT 0.6 05/14/2022   ALKPHOS 123 (H) 05/14/2022   AST 24 05/14/2022   ALT 35 05/14/2022   ANIONGAP 6 (L) 10/06/2013   Last lipids Lab Results  Component Value Date   CHOL 153 05/14/2022   HDL 35 (L) 05/14/2022   LDLCALC 98 05/14/2022   TRIG 106 05/14/2022   CHOLHDL 4.4 05/14/2022   Last hemoglobin A1c Lab Results  Component Value Date   HGBA1C 6.0 (H) 01/22/2022   Last thyroid functions Lab Results  Component Value Date   TSH 1.850 04/27/2019      The 10-year ASCVD risk score (Arnett DK, et al., 2019) is: 12.5%    Assessment & Plan:   Problem List Items Addressed This Visit   None   No  follow-ups on file.    Azzam Mehra Jerold Coombe, NP

## 2022-08-23 ENCOUNTER — Other Ambulatory Visit: Payer: Self-pay

## 2022-08-26 ENCOUNTER — Ambulatory Visit: Payer: Self-pay | Admitting: Licensed Clinical Social Worker

## 2022-08-26 DIAGNOSIS — F411 Generalized anxiety disorder: Secondary | ICD-10-CM

## 2022-08-26 DIAGNOSIS — F339 Major depressive disorder, recurrent, unspecified: Secondary | ICD-10-CM

## 2022-08-26 NOTE — BH Specialist Note (Signed)
Integrated Behavioral Health Follow Up In-Person Visit  MRN: [id number] Name: HANSELL FLUEGEL  Number of [place] Clinician visits: No data recorded Session Start time: No data recorded  Session End time: No data recorded Total time in minutes: No data recorded  Types of Service: Individual psychotherapy  Interpretor:No. Interpretor Name and Language: N/A  Subjective: ZAYDN MCCANTS is a 57 y.o. male accompanied by  himself  Patient was referred by Carlyon Shadow, NP for mental health. Patient reports the following symptoms/concerns: The patient reports that he has been doing about the same since his last follow-up appointment. He noted that he has active Medicaid and is relieved that he will be able to get treatment for some recent health related stressors. He shared that he felt therapy has been beneficial to him and would like to find a therapist who he can connect with. The patient discussed continuing family stressors regarding his son's relationship. He discussed other interpersonal stressors impacting his life currently. The patient denied any suicidal or homicidal thoughts.  Duration of problem: Years; Severity of problem: moderate  Objective: Mood: Euthymic and Affect: Blunt Risk of harm to self or others: No plan to harm self or others  Life Context: Family and Social: see above School/Work: see above Self-Care: see above Life Changes: see above  Patient and/or Family's Strengths/Protective Factors: Social connections, Concrete supports in place (healthy food, safe environments, etc.), Sense of purpose, and Physical Health (exercise, healthy diet, medication compliance, etc.)  Goals Addressed: Patient will:  Reduce symptoms of: agitation, anxiety, depression, insomnia, and stress   Increase knowledge and/or ability of: coping skills, healthy habits, self-management skills, and stress reduction   Demonstrate ability to: Increase healthy  adjustment to current life circumstances  Progress towards Goals: Other  Interventions: Interventions utilized:  Supportive Counselingwas utilized by the clinician during today's follow up session. Clinician met with patient to identify needs related to stressors and functioning, and assess and monitor for signs and symptoms of anxiety and depression, and assess safety. The clinician processed with the patient how they have been doing since the last follow-up session. Clinician processed with the patient his progress in therapy and identified areas that the patient wished to continue to work on. Clinician explained that since the patient has active Medicaid he would need to transition to another provider and gave the patient a list of providers that accept Medicaid in the area. The Clinician provided a space for the patient to vent his frustrations , ask questions, and have his concerns addressed. Clinician explained that the [facility] would provide follow-up calls over the next couple of weeks to ensure a smooth transition of care.  Standardized Assessments completed:  GAD-7 & PHQ-9 Completed on 08/18/2022 GAD-7=11 PHQ-9= 08  Assessment: Patient currently experiencing see above.   Patient may benefit from see above.  Plan: Follow up with behavioral health clinician on :  Behavioral recommendations:  Referral(s): [facility] (LME/Outside Clinic) The patient has Medicaid received referrals to [facility], [facility], and [facility] "From scale of 1-10, how likely are you to follow plan?":   Lesli Albee, LCSWA

## 2022-09-08 ENCOUNTER — Telehealth: Payer: Self-pay

## 2022-09-08 NOTE — Telephone Encounter (Signed)
Sent end of therapy letter per Nira Conn due to pt having insurance.

## 2022-09-24 ENCOUNTER — Telehealth: Payer: Self-pay

## 2022-09-24 NOTE — Telephone Encounter (Signed)
Called pt to check in and see if pt needed any help with finding new PCP or therapist. No answer/ Left msg.

## 2022-10-27 ENCOUNTER — Ambulatory Visit: Payer: Medicaid Other | Admitting: Internal Medicine

## 2022-10-27 ENCOUNTER — Encounter: Payer: Self-pay | Admitting: Internal Medicine

## 2022-10-27 VITALS — BP 144/84 | HR 90 | Temp 97.7°F | Resp 16 | Ht 76.5 in | Wt 259.5 lb

## 2022-10-27 DIAGNOSIS — F4310 Post-traumatic stress disorder, unspecified: Secondary | ICD-10-CM

## 2022-10-27 DIAGNOSIS — K219 Gastro-esophageal reflux disease without esophagitis: Secondary | ICD-10-CM | POA: Diagnosis not present

## 2022-10-27 DIAGNOSIS — Z23 Encounter for immunization: Secondary | ICD-10-CM | POA: Diagnosis not present

## 2022-10-27 DIAGNOSIS — I10 Essential (primary) hypertension: Secondary | ICD-10-CM

## 2022-10-27 DIAGNOSIS — R7303 Prediabetes: Secondary | ICD-10-CM | POA: Diagnosis not present

## 2022-10-27 DIAGNOSIS — R079 Chest pain, unspecified: Secondary | ICD-10-CM
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
## 2022-10-27 NOTE — Patient Instructions (Signed)
It was great seeing you today!  Plan discussed at today's visit: -Continue taking Lisinopril 10 mg daily -Tdap vaccine given today  Follow up in: 1 month   Take care and let us know if you have any questions or concerns prior to your next visit.  Dr. Caralee Ates
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
## 2022-11-26 ENCOUNTER — Ambulatory Visit: Payer: Medicaid Other | Admitting: Internal Medicine
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
## 2022-12-03 ENCOUNTER — Encounter: Payer: Self-pay | Admitting: Internal Medicine

## 2022-12-03 ENCOUNTER — Ambulatory Visit: Payer: Medicaid Other | Admitting: Internal Medicine

## 2022-12-03 VITALS — BP 122/78 | HR 113 | Temp 97.7°F | Resp 16 | Ht 76.5 in | Wt 255.0 lb

## 2022-12-03 DIAGNOSIS — K219 Gastro-esophageal reflux disease without esophagitis: Secondary | ICD-10-CM

## 2022-12-03 DIAGNOSIS — F4310 Post-traumatic stress disorder, unspecified: Secondary | ICD-10-CM | POA: Diagnosis not present

## 2022-12-03 DIAGNOSIS — I10 Essential (primary) hypertension: Secondary | ICD-10-CM

## 2022-12-31 ENCOUNTER — Other Ambulatory Visit: Payer: Self-pay

## 2022-12-31 ENCOUNTER — Other Ambulatory Visit
Admission: RE | Admit: 2022-12-31 | Discharge: 2022-12-31 | Disposition: A | Discharge status: Home / Self Care | Payer: Medicaid Other | Source: Ambulatory Visit | Attending: Cardiology | Admitting: Cardiology

## 2022-12-31 ENCOUNTER — Encounter: Payer: Self-pay | Admitting: Cardiology

## 2022-12-31 ENCOUNTER — Ambulatory Visit: Payer: Medicaid Other | Attending: Cardiology | Admitting: Cardiology

## 2022-12-31 VITALS — BP 140/88 | HR 73 | Ht 77.0 in | Wt 253.2 lb

## 2022-12-31 DIAGNOSIS — I10 Essential (primary) hypertension: Secondary | ICD-10-CM | POA: Diagnosis not present

## 2022-12-31 DIAGNOSIS — R072 Precordial pain: Secondary | ICD-10-CM

## 2022-12-31 DIAGNOSIS — R0609 Other forms of dyspnea: Secondary | ICD-10-CM | POA: Diagnosis not present

## 2022-12-31 LAB — BASIC METABOLIC PANEL
Anion gap: 8 (ref 5–15)
BUN: 14 mg/dL (ref 6–20)
CO2: 25 mmol/L (ref 22–32)
Calcium: 9.1 mg/dL (ref 8.9–10.3)
Chloride: 109 mmol/L (ref 98–111)
Creatinine, Ser: 1.1 mg/dL (ref 0.61–1.24)
GFR, Estimated: >60 mL/min (ref >60)
Glucose, Bld: 102 mg/dL — ABNORMAL HIGH (ref 70–99)
Potassium: 3.3 mmol/L — ABNORMAL LOW (ref 3.5–5.1)
Sodium: 142 mmol/L (ref 135–145)

## 2022-12-31 MED ORDER — METOPROLOL TARTRATE 100 MG PO TABS
100.0000 mg | ORAL_TABLET | Freq: Once | ORAL | 0 refills | Status: DC
  Filled 2022-12-31 – 2023-02-01 (×2): qty 1, 1d supply, fill #0
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
## 2022-12-31 NOTE — Patient Instructions (Signed)
Medication Instructions:   Your physician recommends that you continue on your current medications as directed. Please refer to the Current Medication list given to you today.  *If you need a refill on your cardiac medications before your next appointment, please call your pharmacy*   Lab Work:  Your physician recommends you go to the medical mall for labs -- BMP  If you have labs (blood work) drawn today and your tests are completely normal, you will receive your results only by: MyChart Message (if you have MyChart) OR A paper copy in the mail If you have any lab test that is abnormal or we need to change your treatment, we will call you to review the results.   Testing/Procedures:  Your physician has requested that you have an echocardiogram. Echocardiography is a painless test that uses sound waves to create images of your heart. It provides your doctor with information about the size and shape of your heart and how well your heart's chambers and valves are working. This procedure takes approximately one hour. There are no restrictions for this procedure. Please do NOT wear cologne, perfume, aftershave, or lotions (deodorant is allowed). Please arrive 15 minutes prior to your appointment time.     Your cardiac CT will be scheduled at   [facility] [address] [place], [place] [address] [phone]  OR   [facility] [address] [place], [place] [address] [phone]  If scheduled at [facility] or [facility], please arrive 15 mins early for check-in and test prep.   Please follow these instructions carefully (unless otherwise directed):  Hold all erectile dysfunction medications at least 3 days (72 hrs) prior to test. (Ie viagra, cialis, sildenafil, tadalafil, etc) We will administer nitroglycerin during this exam.   On the Night Before the  Test: Be sure to Drink plenty of water. Do not consume any caffeinated/decaffeinated beverages or chocolate 12 hours prior to your test. Do not take any antihistamines 12 hours prior to your test.  On the Day of the Test: Drink plenty of water until 1 hour prior to the test. Do not eat any food 1 hour prior to test. You may take your regular medications prior to the test.  Take metoprolol (Lopressor) two hours prior to test. HOLD furosemide (LASIX) 40 MG tablet the morning of the test       After the Test: Drink plenty of water. After receiving IV contrast, you may experience a mild flushed feeling. This is normal. On occasion, you may experience a mild rash up to 24 hours after the test. This is not dangerous. If this occurs, you can take Benadryl 25 mg and increase your fluid intake. If you experience trouble breathing, this can be serious. If it is severe call 911 IMMEDIATELY. If it is mild, please call our office. If you take any of these medications: Glipizide/Metformin, Avandament, Glucavance, please do not take 48 hours after completing test unless otherwise instructed.  We will call to schedule your test 2-4 weeks out understanding that some insurance companies will need an authorization prior to the service being performed.   For non-scheduling related questions, please contact the cardiac imaging nurse navigator should you have any questions/concerns: Rockwell Alexandria, Cardiac Imaging Nurse Navigator Larey Brick, Cardiac Imaging Nurse Navigator [facility] Direct Office Dial: [phone]   For scheduling needs, including cancellations and rescheduling, please call [place], [phone].  Follow-Up: At [facility], you and your health needs are our priority.  As part of our continuing mission to provide you with exceptional heart care, we have created designated Provider Care Teams.  These Care Teams include your primary Cardiologist  (physician) and Advanced Practice Providers (APPs -  Physician Assistants and Nurse Practitioners) who all work together to provide you with the care you need, when you need it.  We recommend signing up for the patient portal called "MyChart".  Sign up information is provided on this After Visit Summary.  MyChart is used to connect with patients for Virtual Visits (Telemedicine).  Patients are able to view lab/test results, encounter notes, upcoming appointments, etc.  Non-urgent messages can be sent to your provider as well.   To learn more about what you can do with MyChart, go to [email or web address].    Your next appointment:    After Testing   Provider:   You may see Debbe Odea, MD or one of the following Advanced Practice Providers on your designated Care Team:   Nicolasa Ducking, NP Eula Listen, PA-C Cadence Fransico Michael, PA-C Charlsie Quest, NP

## 2023-01-04 ENCOUNTER — Ambulatory Visit: Payer: Medicaid Other | Admitting: Cardiology

## 2023-01-11 ENCOUNTER — Ambulatory Visit: Payer: Medicaid Other | Attending: Cardiology

## 2023-01-11 DIAGNOSIS — R072 Precordial pain: Secondary | ICD-10-CM | POA: Diagnosis not present

## 2023-01-11 LAB — ECHOCARDIOGRAM COMPLETE
Area-P 1/2: 5.02 cm2
S' Lateral: 2.7 cm

## 2023-01-11 MED ORDER — PERFLUTREN LIPID MICROSPHERE
1.0000 mL | INTRAVENOUS | Status: AC | PRN
Start: 2023-01-11 — End: 2023-01-11
  Administered 2023-01-11: 2 mL via INTRAVENOUS

## 2023-01-15 ENCOUNTER — Other Ambulatory Visit: Payer: Self-pay

## 2023-01-20 ENCOUNTER — Other Ambulatory Visit: Payer: Self-pay

## 2023-02-02 ENCOUNTER — Encounter (HOSPITAL_COMMUNITY): Payer: Self-pay

## 2023-02-02 ENCOUNTER — Other Ambulatory Visit: Payer: Self-pay

## 2023-02-03 ENCOUNTER — Telehealth (HOSPITAL_COMMUNITY): Payer: Self-pay | Admitting: *Deleted

## 2023-02-03 ENCOUNTER — Telehealth (HOSPITAL_COMMUNITY): Payer: Self-pay | Admitting: Emergency Medicine

## 2023-02-03 ENCOUNTER — Ambulatory Visit: Payer: Medicaid Other | Admitting: Cardiology

## 2023-02-03 NOTE — Telephone Encounter (Signed)
Reaching out to patient to offer assistance regarding upcoming cardiac imaging study; pt verbalizes understanding of appt date/time, parking situation and where to check in, pre-test NPO status and medications ordered, and verified current allergies; name and call back number provided for further questions should they arise  Merle Prescott RN Navigator Cardiac Imaging [facility] [phone] office [phone] cell  Patient to take 100mg metoprolol tartrate two hours prior to his cardiac CT scan. 

## 2023-02-03 NOTE — Telephone Encounter (Signed)
Attempted to call patient regarding upcoming cardiac CT appointment. °Left message on voicemail with name and callback number °Sara Wallace RN Navigator Cardiac Imaging °[facility] °[phone] Office °[phone] Cell ° °

## 2023-02-04 ENCOUNTER — Ambulatory Visit
Admission: RE | Admit: 2023-02-04 | Discharge: 2023-02-04 | Disposition: A | Discharge status: Home / Self Care | Payer: Medicaid Other | Source: Ambulatory Visit | Attending: Cardiology | Admitting: Cardiology

## 2023-02-04 DIAGNOSIS — R072 Precordial pain: Secondary | ICD-10-CM

## 2023-02-04 MED ORDER — IOHEXOL 350 MG/ML SOLN
100.0000 mL | Freq: Once | INTRAVENOUS | Status: AC | PRN
  Administered 2023-02-04: 100 mL via INTRAVENOUS

## 2023-02-04 MED ORDER — NITROGLYCERIN 0.4 MG SL SUBL
0.8000 mg | SUBLINGUAL_TABLET | Freq: Once | SUBLINGUAL | Status: AC
  Administered 2023-02-04: 0.8 mg via SUBLINGUAL

## 2023-02-04 MED ORDER — METOPROLOL TARTRATE 5 MG/5ML IV SOLN
10.0000 mg | Freq: Once | INTRAVENOUS | Status: AC
  Administered 2023-02-04: 10 mg via INTRAVENOUS

## 2023-02-04 MED ORDER — SODIUM CHLORIDE 0.9 % IV BOLUS
150.0000 mL | Freq: Once | INTRAVENOUS | Status: AC
  Administered 2023-02-04: 150 mL via INTRAVENOUS

## 2023-02-04 NOTE — Progress Notes (Signed)

## 2023-02-26 ENCOUNTER — Ambulatory Visit: Payer: Medicaid Other | Attending: Cardiology | Admitting: Cardiology

## 2023-02-26 ENCOUNTER — Encounter: Payer: Self-pay | Admitting: Cardiology

## 2023-02-26 VITALS — BP 140/80 | HR 87 | Ht 76.5 in | Wt 252.2 lb

## 2023-02-26 DIAGNOSIS — I10 Essential (primary) hypertension: Secondary | ICD-10-CM

## 2023-02-26 DIAGNOSIS — R0789 Other chest pain: Secondary | ICD-10-CM | POA: Diagnosis not present

## 2023-02-26 DIAGNOSIS — R0609 Other forms of dyspnea: Secondary | ICD-10-CM

## 2023-02-26 DIAGNOSIS — E876 Hypokalemia: Secondary | ICD-10-CM

## 2023-02-26 NOTE — Progress Notes (Signed)
Cardiology Office Note:  .   Date:  02/26/2023  ID:  CHEICK VERT, DOB [birth year], MRN [id number] PCP: Margarita Mail, DO  [facility] Providers Cardiologist:  Debbe Odea, MD    History of Present Illness: .   Wayne Brown is a 57 y.o. male with past medical history of hypertension, Gastrosoft reflux disease, family history of early CAD, PTSD, restless leg syndrome, bilateral lower extremity edema, fatigue, prediabetes, who presents today for follow-up recent concerns with chest pain and shortness of breath.  He was last seen in clinic 12/31/2022 by Dr. Azucena Cecil.  At that time he been having shortness of breath ongoing with exertion for approximately 2 years.  His usually improves his symptoms.  He occasionally with left-sided chest pain.  He has been compliant with his blood pressure medications blood pressure was well-controlled.  He was scheduled for an echocardiogram and a coronary CTA.  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  Has occasional swelling to his bilateral lower extremities.  Has ran out of his lisinopril and has not had his blood pressure medication.  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  ROS: 10 point review of systems has been reviewed and considered negative with exception of what is been listed in the HPI  Studies Reviewed: Marland Kitchen       Coronary CTA 02/04/2023 IMPRESSION: 1. Coronary calcium score of 0.   2. Normal coronary origin with right dominance.   3. No evidence of CAD.   4. CAD-RADS 0. Consider non-atherosclerotic causes of chest pain.  TTE 01/11/23 1. Left ventricular ejection fraction, by estimation, is  60 to 65%. The  left ventricle has normal function. The left ventricle has no regional  wall motion abnormalities. Left ventricular diastolic parameters were  normal.   2. Right ventricular systolic function is normal. The right ventricular  size is normal.   3. The mitral valve is normal in structure. No evidence of mitral valve  regurgitation.   4. The aortic valve is tricuspid. Aortic valve regurgitation is not  visualized.  Risk Assessment/Calculations:     HYPERTENSION CONTROL Vitals:   02/26/23 1337 02/26/23 1355  BP: (!) 142/88 (!) 140/80    The patient's blood pressure is elevated above target today.  In order to address the patient's elevated BP: Blood pressure will be monitored at home to determine if medication changes need to be made. (patietn is out of his bp medication)          Physical Exam:   VS:  BP (!) 140/80 (BP Location: Left Arm, Patient Position: Sitting, Cuff Size: Normal)   Pulse 87   Ht 6' 4.5" (1.943 m)   Wt 252 lb 3.2 oz (114.4 kg)   SpO2 94%   BMI 30.30 kg/m    Wt Readings from Last 3 Encounters:  02/26/23 252 lb 3.2 oz (114.4 kg)  12/31/22 253 lb 3.2 oz (114.9 kg)  12/03/22 255 lb (115.7 kg)    GEN: Well nourished, well developed in no acute distress NECK: No JVD; No carotid bruits CARDIAC: RRR, no murmurs, rubs, gallops RESPIRATORY:  Clear to auscultation without rales, wheezing or rhonchi  ABDOMEN:  Soft, non-tender, non-distended EXTREMITIES:  No edema; No deformity   ASSESSMENT AND PLAN: .   Atypical chest pain with continued dyspnea on exertion.  Coronary CTA revealed coronary calcium score of 0 with no evidence of coronary artery disease.  Was recommended to consider nonatherosclerotic causes of chest pain.  Echocardiogram was completed as well which revealed an EF of 60 to 65%, no regional wall motion abnormalities and no valvular abnormalities were noted.  Continued dyspnea on exertion with normal LVEF without structural  abnormalities noted on echocardiogram.  Likely component of deconditioning.  Patient encouraged to increase activity and minimal walking.  Hypertension with blood pressure elevated today 142/88 and 140/80.  Patient states he has been out of his blood pressure medications.  He is continued on 10 mg lisinopril daily and states that he is going to pick up his prescription that is waiting for him at the pharmacy.  Hypokalemia noted on blood work from June/2024.  Potassium 3.3 on labs. Patient states he was never informed of a drop in his potassium. Repeat BMP ordered today.  Encouraged to increase potassium in his diet.  As he states he does not have the availability to buy over-the-counter supplements if it is continued to be mildly low.  Advised him if he is close to normal we can try with dietary if not he would need to take supplements.        Dispo: Patient to return to clinic to see MD/APP in 3 months or sooner if needed  Signed, Donte Lenzo, NP

## 2023-02-26 NOTE — Patient Instructions (Addendum)
Medication Instructions:  Your physician recommends that you continue on your current medications as directed. Please refer to the Current Medication list given to you today.  *If you need a refill on your cardiac medications before your next appointment, please call your pharmacy*   Lab Work: Your provider would like for you to have following labs drawn today BMP.   If you have labs (blood work) drawn today and your tests are completely normal, you will receive your results only by: MyChart Message (if you have MyChart) OR A paper copy in the mail If you have any lab test that is abnormal or we need to change your treatment, we will call you to review the results.   Testing/Procedures: none   Follow-Up: At [facility], you and your health needs are our priority.  As part of our continuing mission to provide you with exceptional heart care, we have created designated Provider Care Teams.  These Care Teams include your primary Cardiologist (physician) and Advanced Practice Providers (APPs -  Physician Assistants and Nurse Practitioners) who all work together to provide you with the care you need, when you need it.  We recommend signing up for the patient portal called "MyChart".  Sign up information is provided on this After Visit Summary.  MyChart is used to connect with patients for Virtual Visits (Telemedicine).  Patients are able to view lab/test results, encounter notes, upcoming appointments, etc.  Non-urgent messages can be sent to your provider as well.   To learn more about what you can do with MyChart, go to [email or web address].    Your next appointment:   3 month(s)  Provider:   You may see Debbe Odea, MD or one of the following Advanced Practice Providers on your designated Care Team:   Charlsie Quest, NP

## 2023-03-01 NOTE — Progress Notes (Signed)
Kidney function and electrolytes remain stable.  No changes needed to current medication regimen at this time.

## 2023-05-08 DIAGNOSIS — Z885 Allergy status to narcotic agent status: Secondary | ICD-10-CM | POA: Diagnosis not present

## 2023-05-08 DIAGNOSIS — Z79899 Other long term (current) drug therapy: Secondary | ICD-10-CM | POA: Diagnosis not present

## 2023-05-08 DIAGNOSIS — S6992XA Unspecified injury of left wrist, hand and finger(s), initial encounter: Secondary | ICD-10-CM | POA: Diagnosis not present

## 2023-05-08 DIAGNOSIS — Z880 Allergy status to penicillin: Secondary | ICD-10-CM | POA: Diagnosis not present

## 2023-05-08 DIAGNOSIS — Z881 Allergy status to other antibiotic agents status: Secondary | ICD-10-CM | POA: Diagnosis not present

## 2023-05-08 DIAGNOSIS — F603 Borderline personality disorder: Secondary | ICD-10-CM | POA: Diagnosis not present

## 2023-05-08 DIAGNOSIS — S6392XA Sprain of unspecified part of left wrist and hand, initial encounter: Secondary | ICD-10-CM | POA: Diagnosis not present

## 2023-06-02 ENCOUNTER — Ambulatory Visit: Payer: Medicaid Other | Admitting: Cardiology

## 2023-06-04 ENCOUNTER — Ambulatory Visit: Payer: Medicaid Other | Admitting: Internal Medicine

## 2023-06-06 ENCOUNTER — Other Ambulatory Visit: Payer: Self-pay
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
## 2023-06-07 ENCOUNTER — Other Ambulatory Visit: Payer: Self-pay

## 2023-06-07 ENCOUNTER — Ambulatory Visit: Payer: Medicaid Other | Admitting: Internal Medicine

## 2023-06-07 ENCOUNTER — Encounter: Payer: Self-pay | Admitting: Internal Medicine

## 2023-06-07 VITALS — BP 136/80 | HR 105 | Temp 97.8°F | Resp 18 | Ht 76.5 in | Wt 255.8 lb

## 2023-06-07 DIAGNOSIS — Z13220 Encounter for screening for lipoid disorders: Secondary | ICD-10-CM

## 2023-06-07 DIAGNOSIS — R5383 Other fatigue: Secondary | ICD-10-CM | POA: Diagnosis not present

## 2023-06-07 DIAGNOSIS — L918 Other hypertrophic disorders of the skin: Secondary | ICD-10-CM | POA: Diagnosis not present

## 2023-06-07 DIAGNOSIS — I10 Essential (primary) hypertension: Secondary | ICD-10-CM | POA: Diagnosis not present

## 2023-06-07 DIAGNOSIS — Z114 Encounter for screening for human immunodeficiency virus [HIV]: Secondary | ICD-10-CM

## 2023-06-07 DIAGNOSIS — Z1159 Encounter for screening for other viral diseases: Secondary | ICD-10-CM | POA: Diagnosis not present

## 2023-06-07 DIAGNOSIS — K219 Gastro-esophageal reflux disease without esophagitis: Secondary | ICD-10-CM

## 2023-06-07 DIAGNOSIS — R7303 Prediabetes: Secondary | ICD-10-CM | POA: Diagnosis not present

## 2023-06-07 MED ORDER — OMEPRAZOLE 20 MG PO CPDR
20.0000 mg | DELAYED_RELEASE_CAPSULE | Freq: Every day | ORAL | 1 refills | Status: DC
Start: 2023-06-07 — End: 2023-12-08
  Filled 2023-06-07: qty 90, 90d supply, fill #0

## 2023-06-07 MED ORDER — LISINOPRIL 10 MG PO TABS
10.0000 mg | ORAL_TABLET | Freq: Every day | ORAL | 1 refills | Status: DC
Start: 2023-06-07 — End: 2023-12-08
  Filled 2023-06-09: qty 90, 90d supply, fill #0
  Filled 2023-10-15: qty 90, 90d supply, fill #1

## 2023-06-09 LAB — COMPLETE METABOLIC PANEL WITH GFR
AG Ratio: 1.4 (calc) (ref 1.0–2.5)
ALT: 20 U/L (ref 9–46)
AST: 13 U/L (ref 10–35)
Albumin: 4.2 g/dL (ref 3.6–5.1)
Alkaline phosphatase (APISO): 131 U/L (ref 35–144)
BUN: 15 mg/dL (ref 7–25)
CO2: 27 mmol/L (ref 20–32)
Calcium: 9.4 mg/dL (ref 8.6–10.3)
Chloride: 104 mmol/L (ref 98–110)
Creat: 1.06 mg/dL (ref 0.70–1.30)
Globulin: 2.9 g/dL (ref 1.9–3.7)
Glucose, Bld: 136 mg/dL — ABNORMAL HIGH (ref 65–99)
Potassium: 4.2 mmol/L (ref 3.5–5.3)
Sodium: 141 mmol/L (ref 135–146)
Total Bilirubin: 0.8 mg/dL (ref 0.2–1.2)
Total Protein: 7.1 g/dL (ref 6.1–8.1)
eGFR: 83 mL/min/{1.73_m2} (ref >=60)

## 2023-06-09 LAB — CBC WITH DIFFERENTIAL/PLATELET
Absolute Lymphocytes: 2149 {cells}/uL (ref 850–3900)
Absolute Monocytes: 644 {cells}/uL (ref 200–950)
Basophils Absolute: 78 {cells}/uL (ref 0–200)
Basophils Relative: 0.9 %
Eosinophils Absolute: 305 {cells}/uL (ref 15–500)
Eosinophils Relative: 3.5 %
HCT: 44.6 % (ref 38.5–50.0)
Hemoglobin: 14.7 g/dL (ref 13.2–17.1)
MCH: 29.3 pg (ref 27.0–33.0)
MCHC: 33 g/dL (ref 32.0–36.0)
MCV: 89 fL (ref 80.0–100.0)
MPV: 10.8 fL (ref 7.5–12.5)
Monocytes Relative: 7.4 %
Neutro Abs: 5525 {cells}/uL (ref 1500–7800)
Neutrophils Relative %: 63.5 %
Platelets: 233 10*3/uL (ref 140–400)
RBC: 5.01 10*6/uL (ref 4.20–5.80)
RDW: 12.7 % (ref 11.0–15.0)
Total Lymphocyte: 24.7 %
WBC: 8.7 10*3/uL (ref 3.8–10.8)

## 2023-06-09 LAB — TESTOSTERONE: Testosterone: 300 ng/dL (ref 250–827)

## 2023-06-09 LAB — HEMOGLOBIN A1C
Hgb A1c MFr Bld: 6 %{Hb} — ABNORMAL HIGH (ref <5.7)
Mean Plasma Glucose: 126 mg/dL
eAG (mmol/L): 7 mmol/L

## 2023-06-09 LAB — LIPID PANEL
Cholesterol: 144 mg/dL (ref <200)
HDL: 41 mg/dL (ref >=40)
LDL Cholesterol (Calc): 82 mg/dL
Non-HDL Cholesterol (Calc): 103 mg/dL (ref <130)
Total CHOL/HDL Ratio: 3.5 (calc) (ref <5.0)
Triglycerides: 109 mg/dL (ref <150)

## 2023-06-09 LAB — HEPATITIS C ANTIBODY: Hepatitis C Ab: NONREACTIVE

## 2023-06-09 LAB — HIV ANTIBODY (ROUTINE TESTING W REFLEX): HIV 1&2 Ab, 4th Generation: NONREACTIVE

## 2023-06-10 ENCOUNTER — Other Ambulatory Visit: Payer: Self-pay

## 2023-06-14 ENCOUNTER — Other Ambulatory Visit: Payer: Self-pay

## 2023-08-20 DIAGNOSIS — F331 Major depressive disorder, recurrent, moderate: Secondary | ICD-10-CM | POA: Diagnosis not present

## 2023-08-25 DIAGNOSIS — F331 Major depressive disorder, recurrent, moderate: Secondary | ICD-10-CM | POA: Diagnosis not present

## 2023-09-15 ENCOUNTER — Encounter: Payer: Self-pay | Admitting: Dermatology

## 2023-09-15 ENCOUNTER — Ambulatory Visit: Payer: Medicaid Other | Admitting: Dermatology

## 2023-09-15 DIAGNOSIS — L918 Other hypertrophic disorders of the skin: Secondary | ICD-10-CM

## 2023-09-15 NOTE — Progress Notes (Signed)
   New Patient Visit   Subjective  Wayne Brown is a 58 y.o. male who presents for the following: Patient c/o skin tags beneath his arm growing and changing, sometimes the skin tags will get hung on clothing, he would like skin tags removed today.     The following portions of the chart were reviewed this encounter and updated as appropriate: medications, allergies, medical history  Review of Systems:  No other skin or systemic complaints except as noted in HPI or Assessment and Plan.  Objective  Well appearing patient in no apparent distress; mood and affect are within normal limits.   A focused examination was performed of the following areas: left axilla, right axilla    Relevant exam findings are noted in the Assessment and Plan.    Assessment & Plan   SKIN TAGS (Acrochordons)  - Erythematous fleshy, skin-colored pedunculated papules - Benign appearing.  - Symptomatic, irritating, patient would like treated. He is aware that skin tags are benign lesions, and their removal is often not considered medically necessary.  Their insurance may not cover the procedure. We recommend all patients call their insurance company to confirm whether the procedure is covered before having it done. Informed consent was obtained. Procedure Note: - Prior to the procedure, reviewed the expected small wound. Also reviewed the risk of leaving a small scar and the small risk of infection.  PROCEDURE - The areas were prepped with isopropyl alcohol. A small amount of lidocaine 1% with epinephrine was injected at the base of each lesion to achieve good local anesthesia. The skin tags were removed using a snip technique. Aluminum chloride was used for hemostasis. Petrolatum and a bandage were applied. The procedure was tolerated well. - Wound care was reviewed with the patient. They were advised to call with any concerns.  - Locations: bilateral axillae and flanks - Total number of treated  acrochordons 14   ACROCHORDON    Return if symptoms worsen or fail to improve.  IAngelique Holm, CMA, am acting as scribe for Elie Goody, MD .   Documentation: I have reviewed the above documentation for accuracy and completeness, and I agree with the above.  Elie Goody, MD

## 2023-09-15 NOTE — Patient Instructions (Addendum)

## 2023-09-16 DIAGNOSIS — F331 Major depressive disorder, recurrent, moderate: Secondary | ICD-10-CM | POA: Diagnosis not present

## 2023-09-29 DIAGNOSIS — H5213 Myopia, bilateral: Secondary | ICD-10-CM | POA: Diagnosis not present

## 2023-09-30 DIAGNOSIS — F331 Major depressive disorder, recurrent, moderate: Secondary | ICD-10-CM | POA: Diagnosis not present

## 2023-10-14 ENCOUNTER — Ambulatory Visit: Payer: Self-pay

## 2023-10-14 NOTE — Telephone Encounter (Signed)
 Chief Complaint: lower back pain Symptoms: lower right back pain radiates to buttock and thigh Frequency: chronic, worsening and becoming more frequent over the past month Pertinent Negatives: Patient denies numbness, weakness, loss of bowel or bladder control, fever, abdominal pain, blood in urine. Disposition: [] ED /[] Urgent Care (no appt availability in office) / [x] Appointment(In office/virtual)/ []  [facility]/ [] Home Care/ [] Refused Recommended Disposition /[] [facility]/ []  Follow-up with PCP Additional Notes: Patient states he was treating with Tylenol but ran out. Advised he can take ibuprofen and/or tylenol for the pain, use a heating pad and rest with a pillow between or under his knees. Patient states he helped a friend move 2 days ago and the bending and lifting aggravates and old back injury he has. Patient agreeable to acute visit tomorrow with PCP and verbalizes understanding to call back for new or worsening symptoms.   Copied from CRM [id number]. Topic: Clinical - Red Word Triage >> Oct 14, 2023  3:04 PM Turkey B wrote: [facility] that prompted transfer to Nurse Triage: pt called in has severe pain, when bending and lifting things, and has a hard time getting dress with pain Reason for Disposition  [1] Pain radiates into the thigh or further down the leg AND [2] one leg  Answer Assessment - Initial Assessment Questions 1. ONSET: "When did the pain begin?"      He states he has had back pain for 6-7 years, he bent down to grab something and it caused the pain. He states every once in a while he gets a flare up. He states over the past month it has become more frequent.  2. LOCATION: "Where does it hurt?" (upper, mid or lower back)     Right lower back.  3. SEVERITY: "How bad is the pain?"  (e.g., Scale 1-10; mild, moderate, or severe)   - MILD (1-3): Doesn't interfere with normal activities.    - MODERATE (4-7): Interferes with normal activities or  awakens from sleep.    - SEVERE (8-10): Excruciating pain, unable to do any normal activities.      0/10 right now while sitting, states it goes to a 7-8/10 with certain motions/movements.  4. PATTERN: "Is the pain constant?" (e.g., yes, no; constant, intermittent)      Intermittent, worse when active or moving then it is constant.  5. RADIATION: "Does the pain shoot into your legs or somewhere else?"     Radiates down right buttocks and thigh.  6. CAUSE:  "What do you think is causing the back pain?"      He states he thinks it is his sciatic nerve pain.   7. BACK OVERUSE:  "Any recent lifting of heavy objects, strenuous work or exercise?"     Bending over/picking things up and walking up steps makes it worse. He states he helped someone move 2 days ago and was bending over and picking up boxes.  8. MEDICINES: "What have you taken so far for the pain?" (e.g., nothing, acetaminophen, NSAIDS)     He states he takes Tylenol, last dose was a couple days ago and states he ran out.  9. NEUROLOGIC SYMPTOMS: "Do you have any weakness, numbness, or problems with bowel/bladder control?"     Denies.  10. OTHER SYMPTOMS: "Do you have any other symptoms?" (e.g., fever, abdomen pain, burning with urination, blood in urine)       Denies.  11. PREGNANCY: "Is there any chance you are pregnant?" "When was your last  menstrual period?"       N/A.  Protocols used: Back Pain-A-AH

## 2023-10-15 ENCOUNTER — Other Ambulatory Visit: Payer: Self-pay

## 2023-10-15 ENCOUNTER — Ambulatory Visit: Admitting: Internal Medicine

## 2023-10-15 ENCOUNTER — Encounter: Payer: Self-pay | Admitting: Internal Medicine

## 2023-10-15 VITALS — BP 132/70 | HR 100 | Temp 98.1°F | Resp 18 | Ht 76.5 in | Wt 250.7 lb

## 2023-10-15 DIAGNOSIS — M5431 Sciatica, right side: Secondary | ICD-10-CM

## 2023-10-15 MED ORDER — NAPROXEN 500 MG PO TABS
500.0000 mg | ORAL_TABLET | Freq: Two times a day (BID) | ORAL | 0 refills | Status: AC
Start: 2023-10-15 — End: 2023-10-25
  Filled 2023-10-15: qty 20, 10d supply, fill #0

## 2023-10-15 MED ORDER — TIZANIDINE HCL 4 MG PO TABS
4.0000 mg | ORAL_TABLET | Freq: Every evening | ORAL | 0 refills | Status: DC | PRN
Start: 2023-10-15 — End: 2023-12-08
  Filled 2023-10-15: qty 30, 30d supply, fill #0

## 2023-10-15 NOTE — Patient Instructions (Addendum)
 -Take Naproxen 500 mg twice a day for 10 days to reduce inflammation -Also take muscle relaxer at night  -Can use heating pad, moist heat and topical medications like Voltaren and Bengay  Sciatica Rehab Ask your health care provider which exercises are safe for you. Do exercises exactly as told by your health care provider and adjust them as directed. It is normal to feel mild stretching, pulling, tightness, or discomfort as you do these exercises. Stop right away if you feel sudden pain or your pain gets worse. Do not begin these exercises until told by your health care provider. Stretching and range-of-motion exercises These exercises warm up your muscles and joints and improve the movement and flexibility of your hips and back. These exercises also help to relieve pain, numbness, and tingling. Sciatic nerve glide  Sit in a chair with your head facing down toward your chest. Place your hands behind your back. Let your shoulders slump forward. Slowly straighten one of your legs while you tilt your head back as if you are looking toward the ceiling. Only straighten your leg as far as you can without making your symptoms worse. Hold this position for __________ seconds. Slowly return your leg and head back to the starting position. Repeat with your other leg. Repeat __________ times. Complete this exercise __________ times a day. Knee to chest with hip adduction and internal rotation  Lie on your back on a firm surface with both legs straight. Bend one of your knees and move it up toward your chest until you feel a gentle stretch in your lower back and buttock. Then, move your knee toward the shoulder that is on the opposite side from your leg. This is hip adduction and internal rotation. Hold your leg in this position by holding on to the front of your knee. Hold this position for __________ seconds. Slowly return to the starting position. Repeat with your other leg. Repeat __________ times.  Complete this exercise __________ times a day. Prone extension on elbows  Lie on your abdomen on a firm surface. A bed may be too soft for this exercise. Prop yourself up on your elbows. Use your arms to help lift your chest up until you feel a gentle stretch in your abdomen and your lower back. This will place some of your body weight on your elbows. If this is uncomfortable, try stacking pillows under your chest. Your hips should stay down, against the surface that you are lying on. Keep your hip and back muscles relaxed. Hold this position for __________ seconds. Slowly relax your upper body and return to the starting position. Repeat __________ times. Complete this exercise __________ times a day. Strengthening exercises These exercises build strength and endurance in your back. Endurance is the ability to use your muscles for a long time, even after they get tired. Pelvic tilt This exercise strengthens the muscles that lie deep in the abdomen. Lie on your back on a firm surface. Bend your knees and keep your feet flat on the surface. Tense your abdominal muscles. Tip your pelvis up toward the ceiling and flatten your lower back into the firm surface. To help with this exercise, you may place a small towel under your lower back and try to push your back into the towel. Hold this position for __________ seconds. Let your muscles relax completely before you repeat this exercise. Repeat __________ times. Complete this exercise __________ times a day. Alternating arm and leg raises  Get on your hands and  knees on a firm surface. If you are on a hard floor, you may want to use padding, such as an exercise mat, to cushion your knees. Line up your arms and legs. Your hands should be directly below your shoulders, and your knees should be directly below your hips. Lift your left leg behind you. At the same time, raise your right arm and straighten it in front of you. Do not lift your leg higher  than your hip. Do not lift your arm higher than your shoulder. Keep your abdominal and back muscles tight. Keep your hips facing the ground. Do not arch your back. Keep your balance carefully, and do not hold your breath. Hold this position for __________ seconds. Slowly return to the starting position. Repeat with your right leg and your left arm. Repeat __________ times. Complete this exercise __________ times a day. Posture and body mechanics Good posture and healthy body mechanics can help to relieve stress in your body's tissues and joints. Body mechanics refers to the movements and positions of your body while you do your daily activities. Posture is part of body mechanics. Good posture means: Your spine is in its natural S-curve position (neutral). Your shoulders are pulled back slightly. Your head is not tipped forward. Follow these guidelines to improve your posture and body mechanics in your everyday activities. Standing  When standing, keep your spine neutral and your feet about hip width apart. Keep a slight bend in your knees. Your ears, shoulders, and hips should line up. When you do a task in which you stand in one place for a long time, place one foot up on a stable object that is 2-4 inches (5-10 cm) high, such as a footstool. This helps keep your spine neutral. Sitting  When sitting, keep your spine neutral and keep your feet flat on the floor. Use a footrest, if necessary, and keep your thighs parallel to the floor. Avoid rounding your shoulders, and avoid tilting your head forward. When working at a desk or a computer, keep your desk at a height where your hands are slightly lower than your elbows. Slide your chair under your desk so you are close enough to maintain good posture. When working at a computer, place your monitor at a height where you are looking straight ahead and you do not have to tilt your head forward or downward to look at the screen. Resting  When  lying down and resting, avoid positions that are most painful for you. If you have pain with activities such as sitting, bending, stooping, or squatting, lie in a position in which your body does not bend very much. For example, avoid curling up on your side with your arms and knees near your chest (fetal position). If you have pain with activities such as standing for a long time or reaching with your arms, lie with your spine in a neutral position and bend your knees slightly. Try the following positions: Lying on your side with a pillow between your knees. Lying on your back with a pillow under your knees. Lifting  When lifting objects, keep your feet at least shoulder width apart and tighten your abdominal muscles. Bend your knees and hips and keep your spine neutral. It is important to lift using the strength of your legs, not your back. Do not lock your knees straight out. Always ask for help to lift heavy or awkward objects. This information is not intended to replace advice given to you by your  health care provider. Make sure you discuss any questions you have with your health care provider. Document Revised: 10/14/2021 Document Reviewed: 10/14/2021 Elsevier Patient Education  2024 [employer].

## 2023-10-15 NOTE — Progress Notes (Signed)
   Acute Office Visit  Subjective:     Patient ID: Wayne Brown, male    DOB: 09-Jan-1966, 58 y.o.   MRN: [id number]  Chief Complaint  Patient presents with   Sciatica    Right side for 3 weeks   Back Pain    Low back pain    Back Pain Pertinent negatives include no dysuria or fever.   Patient is in today for sciatica on the right side. Has had pain constant for 3 weeks. Pain not too bad while standing, but bending or siting makes it worse. Will radiate behind knee. No weakness. Sharp pain down leg.   BACK PAIN Duration:  3 weeks Mechanism of injury: no trauma Location: Right and low back Onset: gradual Quality: sharp Frequency: constant Radiation: R leg above the knee Aggravating factors: movement, walking, bending, and prolonged sitting Alleviating factors: nothing and rest Status: fluctuating Treatments attempted: rest and APAP  Relief with NSAIDs?: No NSAIDs Taken Nighttime pain:  no Paresthesias / decreased sensation:  no Bowel / bladder incontinence:  no Fevers:  no Dysuria / urinary frequency:  no   Review of Systems  Constitutional:  Negative for fever.  Genitourinary:  Negative for dysuria and urgency.  Musculoskeletal:  Positive for back pain.        Objective:    BP 132/70 (Cuff Size: Large)   Pulse 100   Temp 98.1 F (36.7 C) (Oral)   Resp 18   Ht 6' 4.5" (1.943 m)   Wt 250 lb 11.2 oz (113.7 kg)   SpO2 99%   BMI 30.12 kg/m  BP Readings from Last 3 Encounters:  10/15/23 132/70  06/07/23 136/80  02/26/23 (!) 140/80   Wt Readings from Last 3 Encounters:  10/15/23 250 lb 11.2 oz (113.7 kg)  06/07/23 255 lb 12.8 oz (116 kg)  02/26/23 252 lb 3.2 oz (114.4 kg)      Physical Exam Constitutional:      Appearance: Normal appearance.  HENT:     Head: Normocephalic and atraumatic.  Eyes:     Conjunctiva/sclera: Conjunctivae normal.  Cardiovascular:     Rate and Rhythm: Normal rate and regular rhythm.  Pulmonary:     Effort:  Pulmonary effort is normal.     Breath sounds: Normal breath sounds.  Musculoskeletal:     Lumbar back: Tenderness present. No bony tenderness. Decreased range of motion.  Skin:    General: Skin is warm and dry.  Neurological:     General: No focal deficit present.     Mental Status: He is alert. Mental status is at baseline.  Psychiatric:        Mood and Affect: Mood normal.        Behavior: Behavior normal.     No results found for any visits on 10/15/23.      Assessment & Plan:   1. Sciatica of right side (Primary): Treat with scheduled anti-inflammatory x 10 days and muscle relaxer as needed. Discussed using heating pad, topical medications and stretches printed for the patient.   - naproxen (NAPROSYN) 500 MG tablet; Take 1 tablet (500 mg total) by mouth 2 (two) times daily with a meal for 10 days.  Dispense: 20 tablet; Refill: 0 - tiZANidine (ZANAFLEX) 4 MG tablet; Take 1 tablet (4 mg total) by mouth at bedtime as needed for muscle spasms.  Dispense: 30 tablet; Refill: 0   Return for already scheduled.  Margarita Mail, DO

## 2023-10-21 DIAGNOSIS — F331 Major depressive disorder, recurrent, moderate: Secondary | ICD-10-CM | POA: Diagnosis not present

## 2023-12-06 ENCOUNTER — Ambulatory Visit: Payer: Self-pay | Admitting: Internal Medicine

## 2023-12-08 ENCOUNTER — Other Ambulatory Visit: Payer: Self-pay

## 2023-12-08 ENCOUNTER — Ambulatory Visit: Admitting: Internal Medicine

## 2023-12-08 ENCOUNTER — Encounter: Payer: Self-pay | Admitting: Internal Medicine

## 2023-12-08 VITALS — BP 124/82 | HR 86 | Temp 97.9°F | Resp 16 | Ht 76.5 in | Wt 254.5 lb

## 2023-12-08 DIAGNOSIS — K219 Gastro-esophageal reflux disease without esophagitis: Secondary | ICD-10-CM

## 2023-12-08 DIAGNOSIS — I10 Essential (primary) hypertension: Secondary | ICD-10-CM

## 2023-12-08 DIAGNOSIS — M5412 Radiculopathy, cervical region: Secondary | ICD-10-CM | POA: Diagnosis not present

## 2023-12-08 DIAGNOSIS — N529 Male erectile dysfunction, unspecified: Secondary | ICD-10-CM

## 2023-12-08 DIAGNOSIS — R7303 Prediabetes: Secondary | ICD-10-CM

## 2023-12-08 DIAGNOSIS — M940 Chondrocostal junction syndrome [Tietze]: Secondary | ICD-10-CM

## 2023-12-08 LAB — POCT GLYCOSYLATED HEMOGLOBIN (HGB A1C): Hemoglobin A1C: 5.8 % — AB (ref 4.0–5.6)

## 2023-12-08 MED ORDER — OMEPRAZOLE 20 MG PO CPDR
20.0000 mg | DELAYED_RELEASE_CAPSULE | Freq: Every day | ORAL | 1 refills | Status: DC
  Filled 2023-12-08 – 2024-02-01 (×3): qty 90, 90d supply, fill #0
  Filled 2024-06-06: qty 90, 90d supply, fill #1

## 2023-12-08 MED ORDER — LISINOPRIL 10 MG PO TABS
10.0000 mg | ORAL_TABLET | Freq: Every day | ORAL | 1 refills | Status: DC
  Filled 2023-12-08 – 2024-02-01 (×4): qty 90, 90d supply, fill #0
  Filled 2024-04-14: qty 90, 90d supply, fill #1

## 2023-12-08 MED ORDER — SILDENAFIL CITRATE 25 MG PO TABS
25.0000 mg | ORAL_TABLET | Freq: Every day | ORAL | 0 refills | Status: DC | PRN
Start: 2023-12-08 — End: 2024-01-24
  Filled 2023-12-08: qty 10, 30d supply, fill #0
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
## 2023-12-09 ENCOUNTER — Ambulatory Visit: Payer: Self-pay

## 2023-12-09 ENCOUNTER — Other Ambulatory Visit: Payer: Self-pay

## 2023-12-09 NOTE — Telephone Encounter (Signed)
 This RN was able to connect with patient. Patient stated he called the pharmacist and got his questions answered. Patient has no further questions at this time.   CRM # [id number] Pt states he's not sure how to use medication. I did suggest he give the pharmacy a call.

## 2023-12-09 NOTE — Telephone Encounter (Signed)
 Copied from CRM [id number]. Topic: Clinical - Medication Question >> Dec 09, 2023  4:30 PM Chrystal Crape R wrote: Pt states he's not sure how to use medication. I did suggest he give the pharmacy a call. Reason for Disposition . General information question, no triage required and triager able to answer question  Protocols used: Information Only Call - No Triage-[facility]
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
## 2023-12-22 ENCOUNTER — Encounter: Payer: Self-pay | Admitting: Orthopedic Surgery

## 2023-12-22 ENCOUNTER — Ambulatory Visit: Admitting: Orthopedic Surgery

## 2023-12-22 VITALS — BP 110/72 | Ht 76.0 in | Wt 252.0 lb

## 2023-12-22 DIAGNOSIS — R29898 Other symptoms and signs involving the musculoskeletal system: Secondary | ICD-10-CM | POA: Diagnosis not present

## 2023-12-22 DIAGNOSIS — M5412 Radiculopathy, cervical region: Secondary | ICD-10-CM

## 2023-12-22 DIAGNOSIS — M542 Cervicalgia: Secondary | ICD-10-CM

## 2023-12-22 DIAGNOSIS — M47812 Spondylosis without myelopathy or radiculopathy, cervical region: Secondary | ICD-10-CM | POA: Diagnosis not present

## 2023-12-22 NOTE — Patient Instructions (Signed)
 It was so nice to see you today. Thank you so much for coming in.    We know you have wear and tear in your neck from previous MRI.   I want to get an updated MRI of your neck to look into things further. We will get this approved through your insurance and [facility] will call you to schedule the appointment. Ask about your patient responsibility. You do not need to pay this prior to getting MRI, they can bill you.   Make sure it is the WIDE BORE MRI.   After you have the MRI, it takes 14-21 days for me to get the results back. Once I have them, we will call you to schedule a follow up visit with me to review them.   Please do not hesitate to call if you have any questions or concerns. You can also message me in MyChart.   Lucetta Russel PA-C [phone]     The physicians and staff at [facility] Neurosurgery at [facility] are committed to providing excellent care. You may receive a survey asking for feedback about your experience at our office. We value you your feedback and appreciate you taking the time to to fill it out. The [facility] leadership team is also available to discuss your experience in person, feel free to contact us  [phone].

## 2024-01-05 ENCOUNTER — Ambulatory Visit
Admission: RE | Admit: 2024-01-05 | Discharge: 2024-01-05 | Disposition: A | Discharge status: Home / Self Care | Source: Ambulatory Visit | Attending: Orthopedic Surgery | Admitting: Orthopedic Surgery

## 2024-01-05 DIAGNOSIS — M47812 Spondylosis without myelopathy or radiculopathy, cervical region: Secondary | ICD-10-CM | POA: Diagnosis not present

## 2024-01-05 DIAGNOSIS — M5412 Radiculopathy, cervical region: Secondary | ICD-10-CM | POA: Diagnosis not present

## 2024-01-05 DIAGNOSIS — M47813 Spondylosis without myelopathy or radiculopathy, cervicothoracic region: Secondary | ICD-10-CM | POA: Diagnosis not present

## 2024-01-05 DIAGNOSIS — M542 Cervicalgia: Secondary | ICD-10-CM | POA: Diagnosis not present

## 2024-01-05 DIAGNOSIS — M50222 Other cervical disc displacement at C5-C6 level: Secondary | ICD-10-CM | POA: Diagnosis not present

## 2024-01-05 DIAGNOSIS — R29898 Other symptoms and signs involving the musculoskeletal system: Secondary | ICD-10-CM | POA: Diagnosis not present

## 2024-01-05 DIAGNOSIS — M5021 Other cervical disc displacement,  high cervical region: Secondary | ICD-10-CM | POA: Diagnosis not present

## 2024-01-17 NOTE — Progress Notes (Unsigned)
 Telephone Visit- Progress Note: Referring Physician:  Bernardo Fend, DO [address] [place],  [place] [address]  Primary Physician:  Bernardo Fend, DO  This visit was performed via telephone.  Patient location: home Provider location: office  I spent a total of 10 minutes non-face-to-face activities for this visit on the date of this encounter including review of current clinical condition and response to treatment.    Patient has given verbal consent to this telephone visits and we reviewed the limitations of a telephone visit. Patient wishes to proceed.    Chief Complaint:  review cervical MRI  History of Present Illness: Wayne Brown is a 58 y.o. male has a history of HTN, GERD, PTSD, RLS, depression.    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 Surgery recommended at that time and he declined.   Phone visit scheduled to review his cervical MRI.   His symptoms are the same. He continues with intermittent neck pain with more constant left arm pain to his hand into last three fingers. He is dropping things with left hand and has some difficulty opening bottles. Pain is worse with lifting. He has numbness and tingling in left arm with intermittent electric shocks from elbow to small finger.    He's had weakness in left hand for years.    He does not smoke.    Conservative measures:  Physical therapy: no recent PT, did years ago with no relief.  Multimodal medical therapy including regular antiinflammatories: none  Injections:  Thinks he had cervical injections years ago with no relief.    Past Surgery: none   ALON MAZOR has no symptoms of cervical myelopathy.   Exam: No exam done as this was a telephone encounter.     Imaging: Cervical MRI dated 01/05/24:  FINDINGS: Alignment: Physiologic.   Vertebrae: No acute or suspicious osseous findings.   Cord:  Normal in signal and caliber.   Posterior Fossa, vertebral arteries, paraspinal tissues: Visualized portions of the posterior fossa appear unremarkable.Bilateral vertebral artery flow voids. No significant paraspinal findings.   Disc levels:   C2-3: Mild uncinate spurring and facet hypertrophy contributing to mild left foraminal narrowing. The spinal canal and right foramen are widely patent.   C3-4: Uncinate spurring and bilateral facet hypertrophy contribute to mild spinal stenosis without cord deformity. There is a new posterolateral disc protrusion on the left (best seen on axial image 16/9). This could contribute to left-sided nerve root impingement. Mild underlying foraminal narrowing bilaterally.   C4-5: Similar mild to moderate uncinate spurring and facet hypertrophy. No spinal stenosis or significant foraminal narrowing.   C5-6: Preserved disc height with mild disc bulging, uncinate spurring and bilateral facet hypertrophy. Mild spinal stenosis without cord deformity. Moderate foraminal narrowing bilaterally appears similar to the previous study.   C6-7: Moderate loss of disc height with posterior osteophytes covering diffusely bulging disc material. Stable disc osteophyte complex on the left causing mild left-sided cord flattening. Moderate to severe foraminal narrowing bilaterally, similar to previous study.   C7-T1: Chronic spondylosis with loss of disc height and posterior osteophytes covering diffusely bulging disc material. Chronic left foraminal disc osteophyte complex mildly deforms the cord and contributes to moderate foraminal narrowing on the left. Overall appearance similar to previous study.   IMPRESSION: 1. New posterolateral disc protrusion on the left at C3-4 which could contribute to left-sided nerve root impingement. 2. Otherwise stable multilevel  cervical spondylosis, greatest at C6-7 where there is a left-sided disc osteophyte complex  causing mild cord flattening and moderate to severe foraminal narrowing bilaterally. 3. Stable left foraminal disc osteophyte complex at C7-T1 causing mild cord deformity and moderate foraminal narrowing on the left. 4. Mild spinal stenosis at C3-4 and C5-6. 5. No acute osseous findings or cord signal abnormality.     Electronically Signed   By: Elsie Perone M.D.   On: 01/17/2024 10:09  I have personally reviewed the images and agree with the above interpretation.  Assessment and Plan: Mr. Kielty has 5-6 year history of intermittent neck pain with more constant left arm pain to his hand into last three fingers. He is dropping things with left hand and has some difficulty opening bottles. He has numbness and tingling in left arm with intermittent electric shocks from elbow to small finger.    He's had weakness in left hand for 5-6 years.   He has known cervical spondylosis with left sided disc C3-C4, mild central stenosis C3-C4 and C5-C6, moderate foraminal stenosis C5-C6, spur/disc C7-T1 with moderate left foraminal stenosis. He also has spur/disc C6-C7 with left sided cord flattening and moderate/severe bilateral foraminal stenosis.    EMG from 05/03/19 showed active left C7, C8, and T1 radiculopathy with moderate right carpal tunnel syndrome.    Treatment options discussed with patient and following plan made:   - He's had weakness in left hand for years, this likely would not improve with surgery.  - Will get updated EMG of bilateral upper extremities for further evaluation. Will do with Dr. Maree at [facility].  - Discussed cervical injections to help with pain, he declines.  - Discussed PT for cervical spine, he declines.  - Will schedule follow up once I have his EMG results. Will likely review everything with Dr. Claudene as well.  - He would like to avoid surgery if possible.   Glade Boys PA-C Neurosurgery

## 2024-01-18 ENCOUNTER — Ambulatory Visit (INDEPENDENT_AMBULATORY_CARE_PROVIDER_SITE_OTHER): Admitting: Orthopedic Surgery

## 2024-01-18 ENCOUNTER — Encounter: Payer: Self-pay | Admitting: Orthopedic Surgery

## 2024-01-18 ENCOUNTER — Telehealth: Payer: Self-pay | Admitting: Orthopedic Surgery

## 2024-01-18 DIAGNOSIS — M5412 Radiculopathy, cervical region: Secondary | ICD-10-CM

## 2024-01-18 DIAGNOSIS — M4802 Spinal stenosis, cervical region: Secondary | ICD-10-CM

## 2024-01-18 DIAGNOSIS — G5601 Carpal tunnel syndrome, right upper limb: Secondary | ICD-10-CM | POA: Diagnosis not present

## 2024-01-18 DIAGNOSIS — M50223 Other cervical disc displacement at C6-C7 level: Secondary | ICD-10-CM | POA: Diagnosis not present

## 2024-01-18 DIAGNOSIS — M4722 Other spondylosis with radiculopathy, cervical region: Secondary | ICD-10-CM

## 2024-01-18 DIAGNOSIS — M5021 Other cervical disc displacement,  high cervical region: Secondary | ICD-10-CM

## 2024-01-18 DIAGNOSIS — M47812 Spondylosis without myelopathy or radiculopathy, cervical region: Secondary | ICD-10-CM

## 2024-01-18 DIAGNOSIS — M5020 Other cervical disc displacement, unspecified cervical region: Secondary | ICD-10-CM

## 2024-01-18 DIAGNOSIS — R29898 Other symptoms and signs involving the musculoskeletal system: Secondary | ICD-10-CM

## 2024-01-18 NOTE — Telephone Encounter (Signed)
 I put on orders for EMG of bilateral upper extremities with Dr. Maree at [facility].   Thanks!

## 2024-01-18 NOTE — Telephone Encounter (Signed)
 Order has been faxed

## 2024-01-19 ENCOUNTER — Other Ambulatory Visit: Payer: Self-pay | Admitting: Internal Medicine

## 2024-01-19 ENCOUNTER — Other Ambulatory Visit: Payer: Self-pay

## 2024-01-19 DIAGNOSIS — N529 Male erectile dysfunction, unspecified: Secondary | ICD-10-CM

## 2024-01-20 ENCOUNTER — Other Ambulatory Visit: Payer: Self-pay | Admitting: Internal Medicine

## 2024-01-20 ENCOUNTER — Other Ambulatory Visit: Payer: Self-pay

## 2024-01-20 DIAGNOSIS — N529 Male erectile dysfunction, unspecified: Secondary | ICD-10-CM

## 2024-01-24 ENCOUNTER — Other Ambulatory Visit: Payer: Self-pay

## 2024-01-24 MED FILL — Sildenafil Citrate Tab 25 MG: ORAL | 10 days supply | Qty: 10 | Fill #0 | Status: CN

## 2024-01-24 NOTE — Telephone Encounter (Signed)
 Requested Prescriptions  Pending Prescriptions Disp Refills   sildenafil  (VIAGRA ) 25 MG tablet 10 tablet 2    Sig: Take 1 tablet (25 mg total) by mouth daily as needed for erectile dysfunction.     Urology: Erectile Dysfunction Agents Passed - 01/24/2024  2:34 PM      Passed - AST in normal range and within 360 days    AST  Date Value Ref Range Status  06/07/2023 13 10 - 35 U/L Final   SGOT(AST)  Date Value Ref Range Status  10/06/2013 20 15 - 37 Unit/L Final         Passed - ALT in normal range and within 360 days    ALT  Date Value Ref Range Status  06/07/2023 20 9 - 46 U/L Final   SGPT (ALT)  Date Value Ref Range Status  10/06/2013 42 12 - 78 U/L Final         Passed - Last BP in normal range    BP Readings from Last 1 Encounters:  12/22/23 110/72         Passed - Valid encounter within last 12 months    Recent Outpatient Visits           1 month ago Essential hypertension   [facility] Bernardo Fend, DO   3 months ago Sciatica of right side   [facility] Bernardo Fend, DO       Future Appointments             In 4 months Bernardo Fend, DO [facility]

## 2024-01-26 ENCOUNTER — Other Ambulatory Visit: Payer: Self-pay

## 2024-02-01 ENCOUNTER — Other Ambulatory Visit: Payer: Self-pay

## 2024-02-01 MED FILL — Sildenafil Citrate Tab 25 MG: ORAL | 10 days supply | Qty: 10 | Fill #0 | Status: AC

## 2024-02-02 ENCOUNTER — Other Ambulatory Visit: Payer: Self-pay

## 2024-02-26 MED FILL — Sildenafil Citrate Tab 25 MG: ORAL | 10 days supply | Qty: 10 | Fill #1 | Status: AC

## 2024-02-27 ENCOUNTER — Other Ambulatory Visit: Payer: Self-pay

## 2024-03-01 ENCOUNTER — Other Ambulatory Visit: Payer: Self-pay

## 2024-03-01 MED ORDER — LATANOPROST 0.005 % OP SOLN
1.0000 [drp] | Freq: Every evening | OPHTHALMIC | 5 refills | Status: AC
  Filled 2024-03-01 (×2): qty 7.5, 75d supply, fill #0
  Filled 2024-05-02: qty 7.5, 75d supply, fill #1

## 2024-04-11 DIAGNOSIS — M5431 Sciatica, right side: Secondary | ICD-10-CM | POA: Diagnosis not present

## 2024-04-11 DIAGNOSIS — Z885 Allergy status to narcotic agent status: Secondary | ICD-10-CM | POA: Diagnosis not present

## 2024-04-11 DIAGNOSIS — Z880 Allergy status to penicillin: Secondary | ICD-10-CM | POA: Diagnosis not present

## 2024-04-13 ENCOUNTER — Other Ambulatory Visit: Payer: Self-pay

## 2024-04-13 MED FILL — Sildenafil Citrate Tab 25 MG: ORAL | 10 days supply | Qty: 10 | Fill #2 | Status: AC

## 2024-04-19 ENCOUNTER — Other Ambulatory Visit: Payer: Self-pay

## 2024-04-27 ENCOUNTER — Other Ambulatory Visit: Payer: Self-pay

## 2024-04-27 ENCOUNTER — Other Ambulatory Visit: Payer: Self-pay | Admitting: Internal Medicine

## 2024-04-27 DIAGNOSIS — N529 Male erectile dysfunction, unspecified: Secondary | ICD-10-CM

## 2024-04-28 ENCOUNTER — Other Ambulatory Visit: Payer: Self-pay

## 2024-04-30 ENCOUNTER — Other Ambulatory Visit: Payer: Self-pay

## 2024-05-01 ENCOUNTER — Other Ambulatory Visit: Payer: Self-pay

## 2024-05-01 MED FILL — Sildenafil Citrate Tab 25 MG: ORAL | 10 days supply | Qty: 10 | Fill #0 | Status: AC

## 2024-05-01 NOTE — Telephone Encounter (Signed)
 Requested Prescriptions  Pending Prescriptions Disp Refills   sildenafil  (VIAGRA ) 25 MG tablet 10 tablet 2    Sig: Take 1 tablet (25 mg total) by mouth daily as needed for erectile dysfunction.     Urology: Erectile Dysfunction Agents Passed - 05/01/2024 11:55 AM      Passed - AST in normal range and within 360 days    AST  Date Value Ref Range Status  06/07/2023 13 10 - 35 U/L Final   SGOT(AST)  Date Value Ref Range Status  10/06/2013 20 15 - 37 Unit/L Final         Passed - ALT in normal range and within 360 days    ALT  Date Value Ref Range Status  06/07/2023 20 9 - 46 U/L Final   SGPT (ALT)  Date Value Ref Range Status  10/06/2013 42 12 - 78 U/L Final         Passed - Last BP in normal range    BP Readings from Last 1 Encounters:  12/22/23 110/72         Passed - Valid encounter within last 12 months    Recent Outpatient Visits           4 months ago Essential hypertension   [facility] Bernardo Fend, DO   6 months ago Sciatica of right side   [facility] Bernardo Fend, DO       Future Appointments             In 1 month Bernardo Fend, DO [facility] [place]

## 2024-05-02 ENCOUNTER — Other Ambulatory Visit: Payer: Self-pay

## 2024-05-13 MED FILL — Sildenafil Citrate Tab 25 MG: ORAL | 10 days supply | Qty: 10 | Fill #1 | Status: AC

## 2024-05-14 ENCOUNTER — Other Ambulatory Visit: Payer: Self-pay

## 2024-05-22 ENCOUNTER — Other Ambulatory Visit: Payer: Self-pay

## 2024-05-22 MED FILL — Sildenafil Citrate Tab 25 MG: ORAL | 10 days supply | Qty: 10 | Fill #2 | Status: AC

## 2024-05-26 ENCOUNTER — Other Ambulatory Visit: Payer: Self-pay | Admitting: Internal Medicine

## 2024-05-26 ENCOUNTER — Other Ambulatory Visit: Payer: Self-pay

## 2024-05-26 DIAGNOSIS — Z79899 Other long term (current) drug therapy: Secondary | ICD-10-CM | POA: Diagnosis not present

## 2024-05-26 DIAGNOSIS — M542 Cervicalgia: Secondary | ICD-10-CM | POA: Diagnosis not present

## 2024-05-26 DIAGNOSIS — F419 Anxiety disorder, unspecified: Secondary | ICD-10-CM | POA: Diagnosis not present

## 2024-05-26 DIAGNOSIS — I10 Essential (primary) hypertension: Secondary | ICD-10-CM | POA: Diagnosis not present

## 2024-05-26 DIAGNOSIS — N529 Male erectile dysfunction, unspecified: Secondary | ICD-10-CM

## 2024-05-26 DIAGNOSIS — Z880 Allergy status to penicillin: Secondary | ICD-10-CM | POA: Diagnosis not present

## 2024-05-26 DIAGNOSIS — Z7902 Long term (current) use of antithrombotics/antiplatelets: Secondary | ICD-10-CM | POA: Diagnosis not present

## 2024-05-26 DIAGNOSIS — K219 Gastro-esophageal reflux disease without esophagitis: Secondary | ICD-10-CM | POA: Diagnosis not present

## 2024-05-26 DIAGNOSIS — M25511 Pain in right shoulder: Secondary | ICD-10-CM | POA: Diagnosis not present

## 2024-05-27 ENCOUNTER — Other Ambulatory Visit: Payer: Self-pay

## 2024-05-27 MED FILL — Sildenafil Citrate Tab 25 MG: ORAL | 10 days supply | Qty: 10 | Fill #0 | Status: CN

## 2024-05-27 NOTE — Telephone Encounter (Signed)
 Requested Prescriptions  Pending Prescriptions Disp Refills   sildenafil  (VIAGRA ) 25 MG tablet 10 tablet 5    Sig: Take 1 tablet (25 mg total) by mouth daily as needed for erectile dysfunction.     Urology: Erectile Dysfunction Agents Passed - 05/27/2024  9:23 AM      Passed - AST in normal range and within 360 days    AST  Date Value Ref Range Status  06/07/2023 13 10 - 35 U/L Final   SGOT(AST)  Date Value Ref Range Status  10/06/2013 20 15 - 37 Unit/L Final         Passed - ALT in normal range and within 360 days    ALT  Date Value Ref Range Status  06/07/2023 20 9 - 46 U/L Final   SGPT (ALT)  Date Value Ref Range Status  10/06/2013 42 12 - 78 U/L Final         Passed - Last BP in normal range    BP Readings from Last 1 Encounters:  12/22/23 110/72         Passed - Valid encounter within last 12 months    Recent Outpatient Visits           5 months ago Essential hypertension   [facility] Bernardo Fend, DO   7 months ago Sciatica of right side   [facility] Bernardo Fend, DO       Future Appointments             In 1 week Bernardo Fend, DO [facility] [place]

## 2024-05-29 ENCOUNTER — Other Ambulatory Visit: Payer: Self-pay

## 2024-06-06 ENCOUNTER — Other Ambulatory Visit: Payer: Self-pay

## 2024-06-06 MED FILL — Sildenafil Citrate Tab 25 MG: ORAL | 10 days supply | Qty: 10 | Fill #0 | Status: AC

## 2024-06-09 ENCOUNTER — Ambulatory Visit: Admitting: Internal Medicine

## 2024-06-12 ENCOUNTER — Other Ambulatory Visit: Payer: Self-pay

## 2024-06-21 ENCOUNTER — Other Ambulatory Visit: Payer: Self-pay

## 2024-06-21 MED FILL — Sildenafil Citrate Tab 25 MG: ORAL | 10 days supply | Qty: 10 | Fill #1 | Status: AC

## 2024-06-30 ENCOUNTER — Other Ambulatory Visit: Payer: Self-pay

## 2024-07-20 MED FILL — Sildenafil Citrate Tab 25 MG: ORAL | 10 days supply | Qty: 10 | Fill #2 | Status: CN

## 2024-07-21 ENCOUNTER — Other Ambulatory Visit: Payer: Self-pay

## 2024-07-28 ENCOUNTER — Other Ambulatory Visit: Payer: Self-pay

## 2024-07-28 ENCOUNTER — Encounter: Payer: Self-pay | Admitting: Internal Medicine

## 2024-07-28 ENCOUNTER — Ambulatory Visit: Admitting: Internal Medicine

## 2024-07-28 VITALS — BP 134/80 | HR 85 | Temp 98.0°F | Resp 18 | Ht 76.0 in | Wt 251.9 lb

## 2024-07-28 DIAGNOSIS — N529 Male erectile dysfunction, unspecified: Secondary | ICD-10-CM | POA: Diagnosis not present

## 2024-07-28 DIAGNOSIS — Z13220 Encounter for screening for lipoid disorders: Secondary | ICD-10-CM | POA: Diagnosis not present

## 2024-07-28 DIAGNOSIS — Z125 Encounter for screening for malignant neoplasm of prostate: Secondary | ICD-10-CM

## 2024-07-28 DIAGNOSIS — M5416 Radiculopathy, lumbar region: Secondary | ICD-10-CM | POA: Diagnosis not present

## 2024-07-28 DIAGNOSIS — I10 Essential (primary) hypertension: Secondary | ICD-10-CM | POA: Diagnosis not present

## 2024-07-28 DIAGNOSIS — E538 Deficiency of other specified B group vitamins: Secondary | ICD-10-CM

## 2024-07-28 DIAGNOSIS — K219 Gastro-esophageal reflux disease without esophagitis: Secondary | ICD-10-CM | POA: Diagnosis not present

## 2024-07-28 DIAGNOSIS — R7303 Prediabetes: Secondary | ICD-10-CM

## 2024-07-28 MED ORDER — SILDENAFIL CITRATE 50 MG PO TABS
50.0000 mg | ORAL_TABLET | Freq: Every day | ORAL | 0 refills | Status: DC | PRN
  Filled 2024-07-28: qty 10, 10d supply, fill #0

## 2024-07-28 MED ORDER — OMEPRAZOLE 20 MG PO CPDR
20.0000 mg | DELAYED_RELEASE_CAPSULE | Freq: Every day | ORAL | 1 refills | Status: AC
  Filled 2024-07-28: qty 90, 90d supply, fill #0

## 2024-07-28 MED ORDER — LISINOPRIL 10 MG PO TABS
10.0000 mg | ORAL_TABLET | Freq: Every day | ORAL | 1 refills | Status: AC
  Filled 2024-07-28: qty 90, 90d supply, fill #0
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
## 2024-08-01 ENCOUNTER — Ambulatory Visit: Payer: Self-pay | Admitting: Internal Medicine

## 2024-08-01 LAB — COMPREHENSIVE METABOLIC PANEL WITH GFR
AG Ratio: 1.6 (calc) (ref 1.0–2.5)
ALT: 15 U/L (ref 9–46)
AST: 13 U/L (ref 10–35)
Albumin: 4.2 g/dL (ref 3.6–5.1)
Alkaline phosphatase (APISO): 104 U/L (ref 35–144)
BUN: 16 mg/dL (ref 7–25)
CO2: 29 mmol/L (ref 20–32)
Calcium: 8.8 mg/dL (ref 8.6–10.3)
Chloride: 107 mmol/L (ref 98–110)
Creat: 1.18 mg/dL (ref 0.70–1.30)
Globulin: 2.6 g/dL (ref 1.9–3.7)
Glucose, Bld: 101 mg/dL — ABNORMAL HIGH (ref 65–99)
Potassium: 4.1 mmol/L (ref 3.5–5.3)
Sodium: 143 mmol/L (ref 135–146)
Total Bilirubin: 1.1 mg/dL (ref 0.2–1.2)
Total Protein: 6.8 g/dL (ref 6.1–8.1)
eGFR: 72 mL/min/1.73m2

## 2024-08-01 LAB — CBC WITH DIFFERENTIAL/PLATELET
Absolute Lymphocytes: 2176 {cells}/uL (ref 850–3900)
Absolute Monocytes: 723 {cells}/uL (ref 200–950)
Basophils Absolute: 77 {cells}/uL (ref 0–200)
Basophils Relative: 0.9 %
Eosinophils Absolute: 298 {cells}/uL (ref 15–500)
Eosinophils Relative: 3.5 %
HCT: 43.5 % (ref 39.4–51.1)
Hemoglobin: 14.2 g/dL (ref 13.2–17.1)
MCH: 29.2 pg (ref 27.0–33.0)
MCHC: 32.6 g/dL (ref 31.6–35.4)
MCV: 89.3 fL (ref 81.4–101.7)
MPV: 11.3 fL (ref 7.5–12.5)
Monocytes Relative: 8.5 %
Neutro Abs: 5228 {cells}/uL (ref 1500–7800)
Neutrophils Relative %: 61.5 %
Platelets: 222 Thousand/uL (ref 140–400)
RBC: 4.87 Million/uL (ref 4.20–5.80)
RDW: 13.4 % (ref 11.0–15.0)
Total Lymphocyte: 25.6 %
WBC: 8.5 Thousand/uL (ref 3.8–10.8)

## 2024-08-01 LAB — HEMOGLOBIN A1C
Hgb A1c MFr Bld: 5.7 % — ABNORMAL HIGH
Mean Plasma Glucose: 117 mg/dL
eAG (mmol/L): 6.5 mmol/L

## 2024-08-01 LAB — PSA: PSA: 1.68 ng/mL

## 2024-08-01 LAB — LIPID PANEL
Cholesterol: 139 mg/dL
HDL: 36 mg/dL — ABNORMAL LOW
LDL Cholesterol (Calc): 84 mg/dL
Non-HDL Cholesterol (Calc): 103 mg/dL
Total CHOL/HDL Ratio: 3.9 (calc)
Triglycerides: 101 mg/dL

## 2024-08-01 LAB — VITAMIN B12: Vitamin B-12: 269 pg/mL (ref 200–1100)

## 2024-08-02 ENCOUNTER — Other Ambulatory Visit: Payer: Self-pay | Admitting: Internal Medicine

## 2024-08-02 DIAGNOSIS — N529 Male erectile dysfunction, unspecified: Secondary | ICD-10-CM

## 2024-08-03 ENCOUNTER — Other Ambulatory Visit: Payer: Self-pay

## 2024-08-03 MED ORDER — SILDENAFIL CITRATE 50 MG PO TABS
50.0000 mg | ORAL_TABLET | Freq: Every day | ORAL | 0 refills | Status: DC | PRN
  Filled 2024-08-03 – 2024-08-16 (×2): qty 10, 10d supply, fill #0

## 2024-08-03 NOTE — Telephone Encounter (Signed)
 Requested Prescriptions  Pending Prescriptions Disp Refills   sildenafil  (VIAGRA ) 50 MG tablet 10 tablet 0    Sig: Take 1 tablet (50 mg total) by mouth daily as needed for erectile dysfunction.     Urology: Erectile Dysfunction Agents Passed - 08/03/2024  2:20 PM      Passed - AST in normal range and within 360 days    AST  Date Value Ref Range Status  07/31/2024 13 10 - 35 U/L Final   SGOT(AST)  Date Value Ref Range Status  10/06/2013 20 15 - 37 Unit/L Final         Passed - ALT in normal range and within 360 days    ALT  Date Value Ref Range Status  07/31/2024 15 9 - 46 U/L Final   SGPT (ALT)  Date Value Ref Range Status  10/06/2013 42 12 - 78 U/L Final         Passed - Last BP in normal range    BP Readings from Last 1 Encounters:  07/28/24 134/80         Passed - Valid encounter within last 12 months    Recent Outpatient Visits           6 days ago Essential hypertension   [facility] Bernardo Fend, DO   7 months ago Essential hypertension   [facility] Bernardo Fend, DO   9 months ago Sciatica of right side   [facility] Bernardo Fend, [place]

## 2024-08-14 ENCOUNTER — Other Ambulatory Visit: Payer: Self-pay

## 2024-08-16 ENCOUNTER — Other Ambulatory Visit: Payer: Self-pay

## 2024-08-16 MED ORDER — PREDNISOLONE ACETATE 1 % OP SUSP
OPHTHALMIC | 0 refills | Status: AC
  Filled 2024-08-16: qty 5, 12d supply, fill #0

## 2024-08-22 ENCOUNTER — Other Ambulatory Visit: Payer: Self-pay | Admitting: Internal Medicine

## 2024-08-22 DIAGNOSIS — N529 Male erectile dysfunction, unspecified: Secondary | ICD-10-CM

## 2024-08-23 ENCOUNTER — Telehealth: Payer: Self-pay

## 2024-08-23 ENCOUNTER — Other Ambulatory Visit: Payer: Self-pay | Admitting: Internal Medicine

## 2024-08-23 ENCOUNTER — Other Ambulatory Visit: Payer: Self-pay

## 2024-08-23 DIAGNOSIS — K219 Gastro-esophageal reflux disease without esophagitis: Secondary | ICD-10-CM

## 2024-08-23 DIAGNOSIS — N529 Male erectile dysfunction, unspecified: Secondary | ICD-10-CM

## 2024-08-23 NOTE — Progress Notes (Signed)
" ° °  Telephone encounter was:  Successful.  Complex Care Management Note Care Guide Note  08/23/2024 Name: Wayne Brown MRN: [id number] DOB: [birth year]  Wayne Brown is a 59 y.o. year old male who is a primary care patient of Bernardo Fend, DO . The community resource team was consulted for assistance with Financial Difficulties related to financial strain  SDOH screenings and interventions completed:  Yes  Social Drivers of Health From This Encounter   Food Insecurity: Food Insecurity Present (08/23/2024)   Epic    Worried About Programme Researcher, Broadcasting/film/video in the Last Year: Often true    Ran Out of Food in the Last Year: Often true  Housing: Unknown (08/23/2024)   Epic    Unable to Pay for Housing in the Last Year: No    Homeless in the Last Year: No  Financial Resource Strain: High Risk (08/23/2024)   Overall Financial Resource Strain (CARDIA)    Difficulty of Paying Living Expenses: Hard  Utilities: At Risk (08/23/2024)   Epic    Threatened with loss of utilities: Yes    SDOH Interventions Today    Flowsheet Row Most Recent Value  SDOH Interventions   Food Insecurity Interventions Community Resources Provided, [place] Resources Referral  Housing Interventions Intervention Not Indicated  Utilities Interventions Community Resources Provided, Social Worker Strain Interventions Community Resources Provided, [employer] Referral     Care guide performed the following interventions: Patient provided with information about care guide support team and interviewed to confirm resource needs.Pt is having financial strain and requested [employer] for [employer] to be mailed and emailed to him Email:  [email or web address]. Pt is scheduled with RN 2/232026 11:00   Follow Up Plan:  No further follow up planned at this time. The patient has been provided with needed resources.  Encounter Outcome:  Patient Visit  Completed    Jon Colt [facility]  [facility] - Inpatient Guide, Phone: [phone] Fax: [phone] Website: [email or web address]    "

## 2024-08-23 NOTE — Progress Notes (Signed)
 Complex Care Management Note  Care Guide Note 08/23/2024 Name: Wayne Brown MRN: [id number] DOB: 1965/07/28  Wayne Brown is a 59 y.o. year old male who sees Bernardo Fend, DO for primary care. I reached out to Wayne Brown by phone today to offer complex care management services.  Wayne Brown was given information about Complex Care Management services today including:   The Complex Care Management services include support from the care team which includes your Nurse Care Manager, Clinical Social Worker, or Pharmacist.  The Complex Care Management team is here to help remove barriers to the health concerns and goals most important to you. Complex Care Management services are voluntary, and the patient may decline or stop services at any time by request to their care team member.   Complex Care Management Consent Status: Patient agreed to services and verbal consent obtained.   Follow up plan:  Telephone appointment with complex care management team member scheduled for:  09/11/2024  Encounter Outcome:  Patient Scheduled

## 2024-08-24 ENCOUNTER — Other Ambulatory Visit: Payer: Self-pay

## 2024-08-24 MED FILL — Sildenafil Citrate Tab 50 MG: ORAL | 10 days supply | Qty: 10 | Fill #0 | Status: AC
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
## 2024-08-24 NOTE — Telephone Encounter (Signed)
 Requested Prescriptions  Pending Prescriptions Disp Refills   sildenafil  (VIAGRA ) 50 MG tablet 90 tablet 0    Sig: Take 1 tablet (50 mg total) by mouth daily as needed for erectile dysfunction.     Urology: Erectile Dysfunction Agents Passed - 08/24/2024  3:52 PM      Passed - AST in normal range and within 360 days    AST  Date Value Ref Range Status  07/31/2024 13 10 - 35 U/L Final   SGOT(AST)  Date Value Ref Range Status  10/06/2013 20 15 - 37 Unit/L Final         Passed - ALT in normal range and within 360 days    ALT  Date Value Ref Range Status  07/31/2024 15 9 - 46 U/L Final   SGPT (ALT)  Date Value Ref Range Status  10/06/2013 42 12 - 78 U/L Final         Passed - Last BP in normal range    BP Readings from Last 1 Encounters:  07/28/24 134/80         Passed - Valid encounter within last 12 months    Recent Outpatient Visits           3 weeks ago Essential hypertension   [facility] Bernardo Fend, DO   8 months ago Essential hypertension   [facility] Bernardo Fend, DO   10 months ago Sciatica of right side   [facility] Bernardo Fend, [place]

## 2024-08-30 ENCOUNTER — Encounter: Admitting: Orthopedic Surgery

## 2024-09-11 ENCOUNTER — Telehealth

## 2025-01-25 ENCOUNTER — Ambulatory Visit: Admitting: Internal Medicine
